# Patient Record
Sex: Male | Born: 1985
Health system: Southern US, Community
[De-identification: ages and names within clinical notes are randomized; demographics above are authoritative.]

## PROBLEM LIST (undated history)

## (undated) DIAGNOSIS — I10 Essential (primary) hypertension: Secondary | ICD-10-CM

## (undated) DIAGNOSIS — E785 Hyperlipidemia, unspecified: Secondary | ICD-10-CM

## (undated) DIAGNOSIS — K219 Gastro-esophageal reflux disease without esophagitis: Secondary | ICD-10-CM

## (undated) DIAGNOSIS — E669 Obesity, unspecified: Secondary | ICD-10-CM

## (undated) HISTORY — PX: NASAL FRACTURE SURGERY: SHX718

## (undated) HISTORY — DX: Essential (primary) hypertension: I10

## (undated) HISTORY — DX: Obesity, unspecified: E66.9

## (undated) HISTORY — DX: Gastro-esophageal reflux disease without esophagitis: K21.9

## (undated) HISTORY — DX: Hyperlipidemia, unspecified: E78.5

---

## 2016-09-08 DIAGNOSIS — S46812A Strain of other muscles, fascia and tendons at shoulder and upper arm level, left arm, initial encounter: Secondary | ICD-10-CM | POA: Diagnosis not present

## 2016-09-08 DIAGNOSIS — M62838 Other muscle spasm: Secondary | ICD-10-CM | POA: Diagnosis not present

## 2017-07-06 DIAGNOSIS — S0993XA Unspecified injury of face, initial encounter: Secondary | ICD-10-CM | POA: Diagnosis not present

## 2017-07-06 DIAGNOSIS — G8911 Acute pain due to trauma: Secondary | ICD-10-CM | POA: Diagnosis not present

## 2017-07-06 DIAGNOSIS — S0033XA Contusion of nose, initial encounter: Secondary | ICD-10-CM | POA: Diagnosis not present

## 2017-07-06 DIAGNOSIS — Z79899 Other long term (current) drug therapy: Secondary | ICD-10-CM | POA: Diagnosis not present

## 2017-07-06 DIAGNOSIS — J320 Chronic maxillary sinusitis: Secondary | ICD-10-CM | POA: Diagnosis not present

## 2017-07-06 DIAGNOSIS — S0083XA Contusion of other part of head, initial encounter: Secondary | ICD-10-CM | POA: Diagnosis not present

## 2017-07-06 DIAGNOSIS — S0992XA Unspecified injury of nose, initial encounter: Secondary | ICD-10-CM | POA: Diagnosis not present

## 2017-11-16 DIAGNOSIS — Z23 Encounter for immunization: Secondary | ICD-10-CM | POA: Diagnosis not present

## 2017-11-16 DIAGNOSIS — M549 Dorsalgia, unspecified: Secondary | ICD-10-CM | POA: Diagnosis not present

## 2017-11-16 DIAGNOSIS — R030 Elevated blood-pressure reading, without diagnosis of hypertension: Secondary | ICD-10-CM | POA: Diagnosis not present

## 2017-12-25 DIAGNOSIS — J0140 Acute pansinusitis, unspecified: Secondary | ICD-10-CM | POA: Diagnosis not present

## 2018-02-02 ENCOUNTER — Ambulatory Visit (INDEPENDENT_AMBULATORY_CARE_PROVIDER_SITE_OTHER): Payer: BLUE CROSS/BLUE SHIELD

## 2018-02-02 ENCOUNTER — Ambulatory Visit: Payer: BLUE CROSS/BLUE SHIELD | Admitting: Physician Assistant

## 2018-02-02 ENCOUNTER — Encounter: Payer: Self-pay | Admitting: Physician Assistant

## 2018-02-02 VITALS — BP 146/96 | HR 80 | Ht 70.0 in | Wt 262.0 lb

## 2018-02-02 DIAGNOSIS — Z13220 Encounter for screening for lipoid disorders: Secondary | ICD-10-CM

## 2018-02-02 DIAGNOSIS — F411 Generalized anxiety disorder: Secondary | ICD-10-CM

## 2018-02-02 DIAGNOSIS — R030 Elevated blood-pressure reading, without diagnosis of hypertension: Secondary | ICD-10-CM

## 2018-02-02 DIAGNOSIS — R399 Unspecified symptoms and signs involving the genitourinary system: Secondary | ICD-10-CM | POA: Diagnosis not present

## 2018-02-02 DIAGNOSIS — R1310 Dysphagia, unspecified: Secondary | ICD-10-CM

## 2018-02-02 DIAGNOSIS — R1013 Epigastric pain: Secondary | ICD-10-CM

## 2018-02-02 DIAGNOSIS — M7989 Other specified soft tissue disorders: Secondary | ICD-10-CM | POA: Diagnosis not present

## 2018-02-02 DIAGNOSIS — R740 Nonspecific elevation of levels of transaminase and lactic acid dehydrogenase [LDH]: Secondary | ICD-10-CM

## 2018-02-02 DIAGNOSIS — F341 Dysthymic disorder: Secondary | ICD-10-CM

## 2018-02-02 DIAGNOSIS — R7401 Elevation of levels of liver transaminase levels: Secondary | ICD-10-CM

## 2018-02-02 DIAGNOSIS — R351 Nocturia: Secondary | ICD-10-CM | POA: Diagnosis not present

## 2018-02-02 DIAGNOSIS — Z131 Encounter for screening for diabetes mellitus: Secondary | ICD-10-CM

## 2018-02-02 DIAGNOSIS — G8929 Other chronic pain: Secondary | ICD-10-CM | POA: Diagnosis not present

## 2018-02-02 DIAGNOSIS — Z130 Encounter for screening for diseases of the blood and blood-forming organs and certain disorders involving the immune mechanism: Secondary | ICD-10-CM

## 2018-02-02 DIAGNOSIS — M25571 Pain in right ankle and joints of right foot: Secondary | ICD-10-CM

## 2018-02-02 DIAGNOSIS — Z7689 Persons encountering health services in other specified circumstances: Secondary | ICD-10-CM

## 2018-02-02 DIAGNOSIS — K6289 Other specified diseases of anus and rectum: Secondary | ICD-10-CM

## 2018-02-02 DIAGNOSIS — K5909 Other constipation: Secondary | ICD-10-CM

## 2018-02-02 DIAGNOSIS — F909 Attention-deficit hyperactivity disorder, unspecified type: Secondary | ICD-10-CM

## 2018-02-02 LAB — POCT URINALYSIS DIPSTICK
Bilirubin, UA: NEGATIVE
Blood, UA: NEGATIVE
Glucose, UA: NEGATIVE
Ketones, UA: NEGATIVE
Leukocytes, UA: NEGATIVE
Nitrite, UA: NEGATIVE
Protein, UA: NEGATIVE
SPEC GRAV UA: 1.02 (ref 1.010–1.025)
Urobilinogen, UA: 1 E.U./dL
pH, UA: 8.5 — AB (ref 5.0–8.0)

## 2018-02-02 MED ORDER — DOCUSATE SODIUM 100 MG PO CAPS: 100.0000 mg | ORAL_CAPSULE | Freq: Two times a day (BID) | ORAL | 3 refills | Status: DC

## 2018-02-02 MED ORDER — ATOMOXETINE HCL 40 MG PO CAPS: ORAL_CAPSULE | ORAL | 1 refills | Status: DC

## 2018-02-02 MED ORDER — NONFORMULARY OR COMPOUNDED ITEM: 0 refills | Status: DC

## 2018-02-02 MED ORDER — MELOXICAM 15 MG PO TABS: ORAL_TABLET | ORAL | 3 refills | Status: DC

## 2018-02-02 NOTE — Patient Instructions (Addendum)
1. Acid reflux - okay to continue the antacid medication - follow GERD diet/lifestyle - follow-up with Gastroenterology for possible endoscopy (referral was placed)  2. Urinary frequency - likely due to irritation from excess caffeine - reduce daily caffeine to 2 beverages and avoid caffeine after 3 pm  3. ADHD/Inattention - start Blase MessStraterra - see coping strategies below - follow-up in 1 month  4. Constipation/Rectal pain - start a fiber supplement like Metamucil or Psyllium - start stool softener 1-2 times per day - drink at least 64 onces of water daily - [facility] and apply rectally daily  5. Insomnia - stop Tylenol PM. This is going to get worse before it gets better but we have to re-train your body to sleep without medication - sleep hygiene  Sleep Hygiene . Limiting daytime naps to 30 minutes . Napping does not make up for inadequate nighttime sleep. However, a short nap of 20-30 minutes can help to improve mood, alertness and performance.  . Avoiding stimulants such as  caffeine and nicotine close to bedtime.  And when it comes to alcohol, moderation is key 4. While alcohol is well-known to help you fall asleep faster, too much close to bedtime can disrupt sleep in the second half of the night as the body begins to process the alcohol.    . Exercising to promote good quality sleep.  As little as 10 minutes of aerobic exercise, such as walking or cycling, can drastically improve nighttime sleep quality.  For the best night's sleep, most people should avoid strenuous workouts close to bedtime. However, the effect of intense nighttime exercise on sleep differs from person to person, so find out what works best for you.   . Steering clear of food that can be disruptive right before sleep.   Heavy or rich foods, fatty or fried meals, spicy dishes, citrus fruits, and carbonated drinks can trigger indigestion for some people. When this occurs close to  bedtime, it can lead to painful heartburn that disrupts sleep. . Ensuring adequate exposure to natural light.  This is particularly important for individuals who may not venture outside frequently. Exposure to sunlight during the day, as well as darkness at night, helps to maintain a healthy sleep-wake cycle . Marland Kitchen. Establishing a regular relaxing bedtime routine.  A regular nightly routine helps the body recognize that it is bedtime. This could include taking warm shower or bath, reading a book, or light stretches. When possible, try to avoid emotionally upsetting conversations and activities before attempting to sleep. . Making sure that the sleep environment is pleasant.  Mattress and pillows should be comfortable. The bedroom should be cool - between 60 and 67 degrees - for optimal sleep. Bright light from lamps, cell phone and TV screens can make it difficult to fall asleep4, so turn those light off or adjust them when possible. Consider using blackout curtains, eye shades, ear plugs, "white noise" machines, humidifiers, fans and other devices that can make the bedroom more relaxing. . Meditation. YouTube Kristopher GleeMichael Sealy. There are many smartphone apps as well     Food Choices for Gastroesophageal Reflux Disease, Adult When you have gastroesophageal reflux disease (GERD), the foods you eat and your eating habits are very important. Choosing the right foods can help ease the discomfort of GERD. Consider working with a diet and nutrition specialist (dietitian) to help you make healthy food choices. What general guidelines should I follow?  Eating plan  Choose healthy foods low in  fat, such as fruits, vegetables, whole grains, low-fat dairy products, and lean meat, fish, and poultry.  Eat frequent, small meals instead of three large meals each day. Eat your meals slowly, in a relaxed setting. Avoid bending over or lying down until 2-3 hours after eating.  Limit high-fat foods such as fatty meats or  fried foods.  Limit your intake of oils, butter, and shortening to less than 8 teaspoons each day.  Avoid the following: ? Foods that cause symptoms. These may be different for different people. Keep a food diary to keep track of foods that cause symptoms. ? Alcohol. ? Drinking large amounts of liquid with meals. ? Eating meals during the 2-3 hours before bed.  Cook foods using methods other than frying. This may include baking, grilling, or broiling. Lifestyle  Maintain a healthy weight. Ask your health care provider what weight is healthy for you. If you need to lose weight, work with your health care provider to do so safely.  Exercise for at least 30 minutes on 5 or more days each week, or as told by your health care provider.  Avoid wearing clothes that fit tightly around your waist and chest.  Do not use any products that contain nicotine or tobacco, such as cigarettes and e-cigarettes. If you need help quitting, ask your health care provider.  Sleep with the head of your bed raised. Use a wedge under the mattress or blocks under the bed frame to raise the head of the bed. What foods are not recommended? The items listed may not be a complete list. Talk with your dietitian about what dietary choices are best for you. Grains Pastries or quick breads with added fat. [place] toast. Vegetables Deep fried vegetables. [place] fries. Any vegetables prepared with added fat. Any vegetables that cause symptoms. For some people this may include tomatoes and tomato products, chili peppers, onions and garlic, and horseradish. Fruits Any fruits prepared with added fat. Any fruits that cause symptoms. For some people this may include citrus fruits, such as oranges, grapefruit, pineapple, and lemons. Meats and other protein foods High-fat meats, such as fatty beef or pork, hot dogs, ribs, ham, sausage, salami and bacon. Fried meat or protein, including fried fish and fried chicken. Nuts and nut  butters. Dairy Whole milk and chocolate milk. Sour cream. Cream. Ice cream. Cream cheese. Milk shakes. Beverages Coffee and tea, with or without caffeine. Carbonated beverages. Sodas. Energy drinks. Fruit juice made with acidic fruits (such as orange or grapefruit). Tomato juice. Alcoholic drinks. Fats and oils Butter. Margarine. Shortening. Ghee. Sweets and desserts Chocolate and cocoa. Donuts. Seasoning and other foods Pepper. Peppermint and spearmint. Any condiments, herbs, or seasonings that cause symptoms. For some people, this may include curry, hot sauce, or vinegar-based salad dressings. Summary  When you have gastroesophageal reflux disease (GERD), food and lifestyle choices are very important to help ease the discomfort of GERD.  Eat frequent, small meals instead of three large meals each day. Eat your meals slowly, in a relaxed setting. Avoid bending over or lying down until 2-3 hours after eating.  Limit high-fat foods such as fatty meat or fried foods. This information is not intended to replace advice given to you by your health care provider. Make sure you discuss any questions you have with your health care provider. Document Released: 01/24/2005 Document Revised: 01/26/2016 Document Reviewed: 01/26/2016 Elsevier Interactive Patient Education  2019 [employer] Inc.  Urinary Frequency, Adult Urinary frequency means urinating more often than usual.  You may urinate every 1-2 hours even though you drink a normal amount of fluid and do not have a bladder infection or condition. Although you urinate more often than normal, the total amount of urine produced in a day is normal. With urinary frequency, you may have an urgent need to urinate often. The stress and anxiety of needing to find a bathroom quickly can make this urge worse. This condition may go away on its own or you may need treatment at home. Home treatment may include bladder training, exercises, taking medicines, or making  changes to your diet. Follow these instructions at home: Bladder health   Keep a bladder diary if told by your health care provider. Keep track of: ? What you eat and drink. ? How often you urinate. ? How much you urinate.  Follow a bladder training program if told by your health care provider. This may include: ? Learning to delay going to the bathroom. ? Double urinating (voiding). This helps if you are not completely emptying your bladder. ? Scheduled voiding.  Do Kegel exercises as told by your health care provider. Kegel exercises strengthen the muscles that help control urination, which may help the condition. Eating and drinking  If told by your health care provider, make diet changes, such as: ? Avoiding caffeine. ? Drinking fewer fluids, especially alcohol. ? Not drinking in the evening. ? Avoiding foods or drinks that may irritate the bladder. These include coffee, tea, soda, artificial sweeteners, citrus, tomato-based foods, and chocolate. ? Eating foods that help prevent or ease constipation. Constipation can make this condition worse. Your health care provider may recommend that you:  Drink enough fluid to keep your urine pale yellow.  Take over-the-counter or prescription medicines.  Eat foods that are high in fiber, such as beans, whole grains, and fresh fruits and vegetables.  Limit foods that are high in fat and processed sugars, such as fried or sweet foods. General instructions  Take over-the-counter and prescription medicines only as told by your health care provider.  Keep all follow-up visits as told by your health care provider. This is important. Contact a health care provider if:  You start urinating more often.  You feel pain or irritation when you urinate.  You notice blood in your urine.  Your urine looks cloudy.  You develop a fever.  You begin vomiting. Get help right away if:  You are unable to urinate. Summary  Urinary frequency  means urinating more often than usual. With urinary frequency, you may urinate every 1-2 hours even though you drink a normal amount of fluid and do not have a bladder infection or other bladder condition.  Your health care provider may recommend that you keep a bladder diary, follow a bladder training program, or make dietary changes.  If told by your health care provider, do Kegel exercises to strengthen the muscles that help control urination.  Take over-the-counter and prescription medicines only as told by your health care provider.  Contact a health care provider if your symptoms do not improve or get worse. This information is not intended to replace advice given to you by your health care provider. Make sure you discuss any questions you have with your health care provider. Document Released: 11/20/2008 Document Revised: 08/03/2017 Document Reviewed: 08/03/2017 Elsevier Interactive Patient Education  2019 Elsevier Inc.   Attention Deficit Hyperactivity Disorder, Adult Attention deficit hyperactivity disorder (ADHD) is a mental health disorder that starts during childhood. For many people with ADHD, the  disorder continues into adult years. There are many things that you and your health care provider or therapist (mental health professional) can do to manage your symptoms. What are the causes? The exact cause of ADHD is not known. What increases the risk? You are more likely to develop this condition if:  You have a family history of ADHD.  You are male.  You were born to a mother who smoked or drank alcohol during pregnancy.  You were exposed to lead poisoning or other toxins in the womb or in early life.  You were born before 37 weeks of pregnancy (prematurely) or you had a low birth weight.  You have experienced a brain injury. What are the signs or symptoms? Symptoms of this condition depend on the type of ADHD. The two main types are inattentive and hyperactive-impulsive.  Some people may have symptoms of both types. Symptoms of the inattentive type include:  Difficulty watching, listening, or thinking with focused effort (paying attention).  Making careless mistakes.  Not listening.  Not following instructions.  Being disorganized.  Avoiding tasks that require time and attention.  Losing things.  Forgetting things.  Being easily distracted. Symptoms of the hyperactive-impulsive type include:  Restlessness.  Talking too much.  Interrupting.  Difficulty with: ? Sitting still. ? Staying quiet. ? Feeling motivated. ? Relaxing. ? Waiting in line or waiting for a turn. How is this diagnosed? This condition is diagnosed based on your current symptoms and your history of symptoms. The diagnosis can be made by a provider such as a primary care provider, psychiatrist, psychologist, or clinical social worker. The provider may use a symptom checklist or a standardized behavior rating scale to evaluate your symptoms. He or she may want to talk with family members who have known you for a long time and have observed your behaviors. There are no lab tests or brain imaging tests that can diagnose ADHD. How is this treated? This condition can be treated with medicines and behavior therapy. Medicines may be the best option to reduce impulsive behaviors and improve attention. Your health care provider may recommend:  Stimulant medicines. These are the most common medicines used for adult ADHD. They affect certain chemicals in the brain (neurotransmitters). These medicines may be long-acting or short-acting. This will determine how often you need to take the medicine.  A non-stimulant medicine for adult ADHD (atomoxetine). This medicine increases a neurotransmitter called norepinephrine. It may take weeks to months to see effects from this medicine. Psychotherapy and behavioral management are also important for treating ADHD. Psychotherapy is often used along  with medicine. Your health care provider may suggest:  Cognitive behavioral therapy (CBT). This type of therapy teaches you to replace negative thoughts and actions with positive thoughts and actions. When used as part of ADHD treatment, this therapy may also include: ? Coping strategies for organization, time management, impulse control, and stress reduction. ? Mindfulness and meditation training.  Behavioral management. This may include strategies for organization and time management. You may work with an ADHD coach who is specially trained to help people with ADHD to manage and organize activities and to function more effectively. Follow these instructions at home: Medicines   Take over-the-counter and prescription medicines only as told by your health care provider.  Talk with your health care provider about the possible side effects of your medicine to watch for. General instructions   Learn as much as you can about adult ADHD, and work closely with your health care  providers to find the treatments that work best for you.  Do not use drugs or abuse alcohol. Limit alcohol intake to no more than 1 drink a day for nonpregnant women and 2 drinks a day for men. One drink equals 12 oz of beer, 5 oz of wine, or 1 oz of hard liquor.  Follow the same schedule each day. Make sure your schedule includes enough time for you to get plenty of sleep.  Use reminder devices like notes, calendars, and phone apps to stay on-time and organized.  Eat a healthy diet. Do not skip meals.  Exercise regularly. Exercise can help to reduce stress and anxiety.  Keep all follow-up visits as told by your health care provider and therapist. This is important. Where to find more information  A health care provider may be able to recommend resources that are available online or over the phone. You could start with: ? Attention Deficit Disorder Association (ADDA): http://davis-dillon.net/www.add.org ? [employer]  [facility](NIMH): http://www.maynard.net/www.nimh.nih.gov Contact a health care provider if:  Your symptoms are changing, getting worse, or not improving.  You have side effects from your medicine, such as: ? Repeated muscle twitches, coughing, or speech outbursts. ? Sleep problems. ? Loss of appetite. ? Depression. ? New or worsening behavior problems. ? Dizziness. ? Unusually fast heartbeat. ? Stomach pains. ? Headaches.  You are struggling with anxiety, depression, or substance abuse. Get help right away if:  You have a severe reaction to a medicine.  You have thoughts of hurting yourself or others. If you ever feel like you may hurt yourself or others, or have thoughts about taking your own life, get help right away. You can go to the nearest emergency department or call:  Your local emergency services (911 in the [place]  A suicide crisis helpline, such as the National Suicide Prevention Lifeline at 225-575-47491-8580066531. This is open 24 hours a day. Summary  ADHD is a mental health disorder that starts during childhood and often continues into adult years.  The exact cause of ADHD is not known.  There is no cure for ADHD, but treatment with medicine, therapy, or behavioral training can help you manage your condition. This information is not intended to replace advice given to you by your health care provider. Make sure you discuss any questions you have with your health care provider. Document Released: 09/15/2016 Document Revised: 09/15/2016 Document Reviewed: 09/15/2016 Elsevier Interactive Patient Education  2019 [employer]

## 2018-02-02 NOTE — Assessment & Plan Note (Signed)
For years now. We will start conservatively, no pain today. Meloxicam, x-rays, home rehab exercises. Return for custom molded orthotics.

## 2018-02-02 NOTE — Progress Notes (Signed)
HPI:                                                                Steve Bentley is a 32 y.o. male who presents to [facility]: Primary Care Sports Medicine today to establish care  Current concerns:   Endorses chronic nocturia up to 4-5 times per night since age 35. Sometimes he has pressure and discomfort in his testicular area with voiding. Denies exposure to STI. Sexually active with 1 male partner (wife) Admits to drinking 4-5 sodas per day and 1 energy drink per day  IPSS Questionnaire (AUA-7): Over the past month.   1)  How often have you had a sensation of not emptying your bladder completely after you finish urinating?  5 - Almost always  2)  How often have you had to urinate again less than two hours after you finished urinating? 4 - More than half the time  3)  How often have you found you stopped and started again several times when you urinated?  2 - Less than half the time  4) How difficult have you found it to postpone urination?  3 - About half the time  5) How often have you had a weak urinary stream?  2 - Less than half the time  6) How often have you had to push or strain to begin urination?  1 - Less than half the time  7) How many times did you most typically get up to urinate from the time you went to bed until the time you got up in the morning?  4 - 4 time  Total score:  0-7 mildly symptomatic   8-19 moderately symptomatic   20-35 severely symptomatic    He also states he has pretty bad acid reflux. Reports he gets bubbly sensation in his throat  He has been taking mothers Omeprazole prescription once-twice per day for the last 2 years Occasional regurgitation and dysphagia where he can only swallow liquids.   Reports he has been having focus issues at work. This has been a chronic problem for 5 years, gradually worsening over the last 2 years. 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 Reports he has made careless errors. Self-reported history of ADD, diagnosed age 89. No current medications Reports he has been taking Tylenol PM at bedtime for 15 years.    Depression screen PHQ 2/9 02/02/2018  Decreased Interest 1  Down, Depressed, Hopeless 1  PHQ - 2 Score 2  Altered sleeping 3  Tired, decreased energy 1  Change in appetite 1  Feeling bad or failure about yourself  0  Trouble concentrating 3  Moving slowly or fidgety/restless 3  Suicidal thoughts 0  PHQ-9 Score 13  Difficult doing work/chores Extremely dIfficult    GAD 7 : Generalized Anxiety Score 02/02/2018  Nervous, Anxious, on Edge 1  Control/stop worrying 0  Worry too much - different things 0  Trouble relaxing 3  Restless 2  Easily annoyed or irritable 1  Afraid - awful might happen 0  Total GAD 7 Score 7  Anxiety Difficulty Extremely difficult       Adult ADHD Self Report Scale (most recent)  Adult ADHD Self-Report Scale (ASRS-v1.1) Symptom Checklist - 02/02/18 1359      Part A   1. How often do you have trouble wrapping up the final details of a project, once the challenging parts have been done?  (!) Very Often  2. How often do you have difficulty getting things done in order when you have to do a task that requires organization?  (!) Very Often    3. How often do you have problems remembering appointments or obligations?  (!) Sometimes  4. When you have a task that requires a lot of thought, how often do you avoid or delay getting started?  (!) Often    5. How often do you fidget or squirm with your hands or feet when you have to sit down for a long time?  (!) Very Often  6. How often do you feel overly active and compelled to do things, like you were driven by a motor?  Sometimes      Part B   7. How often do you make careless mistakes when you have to work on a boring or difficult project?  (!) Very Often  8. How often do you have difficulty keeping your attention when you are doing  boring or repetitive work?  (!) Very Often    9. How often do you have difficulty concentrating on what people say to you, even when they are speaking to you directly?  (!) Often  10. How often do you misplace or have difficulty finding things at home or at work?  Sometimes    11. How often are you distracted by activity or noise around you?  (!) Often  12. How often do you leave your seat in meetings or other situations in which you are expected to remain seated?  (!) Sometimes    13. How often do you feel restless or fidgety?  (!) Very Often  14. How often do you have difficulty unwinding and relaxing when you have time to yourself?  (!) Very Often    15. How often do you find yourself talking too much when you are in social situations?  Rarely  16. When you are in a conversation, how often do you find yourself finishing the sentences of the people you are talking to, before they can finish them themselves?  (!) Often    17. How often do you have difficulty waiting your turn in situations when turn taking is required?  (!) Very Often  18. How often do you interrupt others when they are busy?  (!) Sometimes      Comment   How old were you when these problems first began to occur?  9            Past Medical History:  Diagnosis Date  . GERD (gastroesophageal reflux disease)    Past Surgical History:  Procedure Laterality Date  . NASAL FRACTURE SURGERY     Social History   Tobacco Use  . Smoking status: Never Smoker  . Smokeless tobacco: Never Used  Substance Use Topics  . Alcohol use: Yes    Alcohol/week: 1.0 standard drinks    Types: 1 Cans of beer per week   family history includes COPD in his mother; Diabetes in his maternal aunt, maternal aunt, and maternal uncle; GER disease in his mother; Glaucoma in his mother; Skin cancer in his maternal uncle.    ROS: Review of Systems  Gastrointestinal: Positive for heartburn.  Genitourinary:       +  nocturia  Musculoskeletal:  Positive for back pain and joint pain.  Psychiatric/Behavioral: The patient has insomnia.        + inattention     Medications: Current Outpatient Medications  Medication Sig Dispense Refill  . diphenhydramine-acetaminophen (TYLENOL PM) 25-500 MG TABS tablet Take 1 tablet by mouth at bedtime as needed.    Marland Kitchen atomoxetine (STRATTERA) 40 MG capsule Take 1 capsule (40 mg total) by mouth daily for 7 days, THEN 2 capsules (80 mg total) daily for 23 days. 60 capsule 1  . docusate sodium (COLACE) 100 MG capsule Take 1 capsule (100 mg total) by mouth 2 (two) times daily. 90 capsule 3  . meloxicam (MOBIC) 15 MG tablet One tab PO qAM with breakfast for 2 weeks, then daily prn pain. 30 tablet 3  . NONFORMULARY OR COMPOUNDED ITEM Diltiazem 2.7% plus lidocaine ointment. Apply rectally daily 1 each 0   No current facility-administered medications for this visit.    No Known Allergies     Objective:  BP (!) 146/96   Pulse 80   Ht 5\' 10"  (1.778 m)   Wt 262 lb (118.8 kg)   BMI 37.59 kg/m  Gen:  alert, not ill-appearing, no distress, appropriate for age, obese male HEENT: head normocephalic without obvious abnormality, conjunctiva and cornea clear, trachea midline Pulm: Normal work of breathing, normal phonation, clear to auscultation bilaterally, no wheezes, rales or rhonchi CV: Normal rate, regular rhythm, s1 and s2 distinct, no murmurs, clicks or rubs  Neuro: alert and oriented x 3, no tremor MSK: extremities atraumatic, normal gait and station GU: circumcised male, normal descended testes bilaterally, no tenderness of the teste or epididymis bilaterally Rectum: multiple skin tags, normal rectal tone, tenderness of the lower internal anal wall at 6 o'clock, no visualized anal fissure Skin: intact, no rashes on exposed skin, no jaundice, no cyanosis Psych: well-groomed, cooperative, good eye contact, euthymic mood, affect mood-congruent, speech is articulate, and thought processes clear and  goal-directed  A chaperone was used for the GU/rectal exam, Ardell Isaacs, RMA  Results for orders placed or performed in visit on 02/02/18 (from the past 72 hour(s))  POCT Urinalysis Dipstick     Status: Abnormal   Collection Time: 02/02/18  2:01 PM  Result Value Ref Range   Color, UA yellow    Clarity, UA clear    Glucose, UA Negative Negative   Bilirubin, UA negative    Ketones, UA negative    Spec Grav, UA 1.020 1.010 - 1.025   Blood, UA negative    pH, UA 8.5 (A) 5.0 - 8.0   Protein, UA Negative Negative   Urobilinogen, UA 1.0 0.2 or 1.0 E.U./dL   Nitrite, UA negative    Leukocytes, UA Negative Negative   Appearance     Odor    CBC     Status: None   Collection Time: 02/02/18  2:40 PM  Result Value Ref Range   WBC 6.9 3.8 - 10.8 Thousand/uL   RBC 5.45 4.20 - 5.80 Million/uL   Hemoglobin 15.8 13.2 - 17.1 g/dL   HCT 16.1 09.6 - 04.5 %   MCV 85.5 80.0 - 100.0 fL   MCH 29.0 27.0 - 33.0 pg   MCHC 33.9 32.0 - 36.0 g/dL   RDW 40.9 81.1 - 91.4 %   Platelets 301 140 - 400 Thousand/uL   MPV 9.6 7.5 - 12.5 fL  COMPLETE METABOLIC PANEL WITH GFR     Status: Abnormal   Collection Time: 02/02/18  2:40 PM  Result Value Ref Range   Glucose, Bld 101 (H) 65 - 99 mg/dL    Comment: .            Fasting reference interval . For someone without known diabetes, a glucose value between 100 and 125 mg/dL is consistent with prediabetes and should be confirmed with a follow-up test. .    BUN 10 7 - 25 mg/dL   Creat 1.611.04 0.960.60 - 0.451.35 mg/dL   GFR, Est Non African American 95 > OR = 60 mL/min/1.7273m2   GFR, Est African American 110 > OR = 60 mL/min/1.6573m2   BUN/Creatinine Ratio NOT APPLICABLE 6 - 22 (calc)   Sodium 138 135 - 146 mmol/L   Potassium 4.1 3.5 - 5.3 mmol/L   Chloride 101 98 - 110 mmol/L   CO2 28 20 - 32 mmol/L   Calcium 9.6 8.6 - 10.3 mg/dL   Total Protein 7.5 6.1 - 8.1 g/dL   Albumin 4.4 3.6 - 5.1 g/dL   Globulin 3.1 1.9 - 3.7 g/dL (calc)   AG Ratio 1.4 1.0 - 2.5 (calc)    Total Bilirubin 0.4 0.2 - 1.2 mg/dL   Alkaline phosphatase (APISO) 61 40 - 115 U/L   AST 30 10 - 40 U/L   ALT 52 (H) 9 - 46 U/L  Hemoglobin A1c     Status: None   Collection Time: 02/02/18  2:40 PM  Result Value Ref Range   Hgb A1c MFr Bld 5.3 <5.7 % of total Hgb    Comment: For the purpose of screening for the presence of diabetes: . <5.7%       Consistent with the absence of diabetes 5.7-6.4%    Consistent with increased risk for diabetes             (prediabetes) > or =6.5%  Consistent with diabetes . This assay result is consistent with a decreased risk of diabetes. . Currently, no consensus exists regarding use of hemoglobin A1c for diagnosis of diabetes in children. . According to American Diabetes Association (ADA) guidelines, hemoglobin A1c <7.0% represents optimal control in non-pregnant diabetic patients. Different metrics may apply to specific patient populations.  Standards of Medical Care in Diabetes(ADA). .    Mean Plasma Glucose 105 (calc)   eAG (mmol/L) 5.8 (calc)  Lipid Panel w/reflex Direct LDL     Status: Abnormal   Collection Time: 02/02/18  2:40 PM  Result Value Ref Range   Cholesterol 188 <200 mg/dL   HDL 30 (L) >40>40 mg/dL   Triglycerides 981349 (H) <150 mg/dL    Comment: . If a non-fasting specimen was collected, consider repeat triglyceride testing on a fasting specimen if clinically indicated.  Perry MountJacobson et al. J. of Clin. Lipidol. 2015;9:129-169. Marland Kitchen.    LDL Cholesterol (Calc) 111 (H) mg/dL (calc)    Comment: Reference range: <100 . Desirable range <100 mg/dL for primary prevention;   <70 mg/dL for patients with CHD or diabetic patients  with > or = 2 CHD risk factors. Marland Kitchen. LDL-C is now calculated using the Martin-Hopkins  calculation, which is a validated novel method providing  better accuracy than the Friedewald equation in the  estimation of LDL-C.  Horald PollenMartin SS et al. Lenox AhrJAMA. 1914;782(952013;310(19): 2061-2068   (http://education.[facility].com/faq/FAQ164)    Total CHOL/HDL Ratio 6.3 (H) <5.0 (calc)   Non-HDL Cholesterol (Calc) 158 (H) <130 mg/dL (calc)    Comment: For patients with diabetes plus 1 major ASCVD risk  factor, treating to a non-HDL-C goal of <100  mg/dL  (LDL-C of <30<70 mg/dL) is considered a therapeutic  option.   Lipase     Status: None   Collection Time: 02/02/18  2:40 PM  Result Value Ref Range   Lipase 25 7 - 60 U/L  PSA     Status: None   Collection Time: 02/02/18  2:40 PM  Result Value Ref Range   PSA 0.2 < OR = 4.0 ng/mL    Comment: The total PSA value from this assay system is  standardized against the WHO standard. The test  result will be approximately 20% lower when compared  to the equimolar-standardized total PSA (Beckman  Coulter). Comparison of serial PSA results should be  interpreted with this fact in mind. . This test was performed using the Siemens  chemiluminescent method. Values obtained from  different assay methods cannot be used interchangeably. PSA levels, regardless of value, should not be interpreted as absolute evidence of the presence or absence of disease.   C. trachomatis/N. gonorrhoeae RNA     Status: None   Collection Time: 02/02/18  4:50 PM  Result Value Ref Range   C. trachomatis RNA, TMA NOT DETECTED NOT DETECT   N. gonorrhoeae RNA, TMA NOT DETECTED NOT DETECT    Comment: This test was performed using the APTIMA COMBO2 Assay ([employer] . The analytical performance characteristics of this  assay, when used to test SurePath specimens have been determined by [facility]. .    Dg Ankle Complete Right  Result Date: 02/02/2018 CLINICAL DATA:  Right ankle popping and swelling for 5 days. EXAM: RIGHT ANKLE - COMPLETE 3+ VIEW COMPARISON:  None. FINDINGS: There is no evidence of fracture, dislocation, or joint effusion. There is no evidence of arthropathy or other focal bone abnormality. Soft tissues are unremarkable.  IMPRESSION: Negative. Electronically Signed   By: Amie Portlandavid  Ormond M.D.   On: 02/02/2018 15:00      Assessment and Plan: 32 y.o. male with   .Steve JunesBrandon was seen today for establish care.  Diagnoses and all orders for this visit:  Encounter to establish care  Dyspepsia -     Ambulatory referral to Gastroenterology -     Lipase -     Lipase  Dysphagia, unspecified type -     Ambulatory referral to Gastroenterology  Nocturia -     POCT Urinalysis Dipstick -     PSA -     C. trachomatis/N. gonorrhoeae RNA -     PSA  Lower urinary tract symptoms (LUTS) -     PSA -     C. trachomatis/N. gonorrhoeae RNA -     PSA  Adult ADHD -     atomoxetine (STRATTERA) 40 MG capsule; Take 1 capsule (40 mg total) by mouth daily for 7 days, THEN 2 capsules (80 mg total) daily for 23 days.  Screening for diabetes mellitus -     Hemoglobin A1c -     POCT Urinalysis Dipstick  Screening for lipid disorders -     Lipid Panel w/reflex Direct LDL  Elevated blood pressure reading -     COMPLETE METABOLIC PANEL WITH GFR  Screening for blood disease -     CBC  Chronic pain of right ankle -     DG Ankle Complete Right; Future -     meloxicam (MOBIC) 15 MG tablet; One tab PO qAM with breakfast for 2 weeks, then daily prn pain.  Chronic constipation -     docusate sodium (COLACE) 100 MG capsule;  Take 1 capsule (100 mg total) by mouth 2 (two) times daily.  Rectal pain -     NONFORMULARY OR COMPOUNDED ITEM; Diltiazem 2.7% plus lidocaine ointment. Apply rectally daily  GAD (generalized anxiety disorder)  Dysthymia   LUTS/nocturia - UA negative - PSA pending - unable to palpate prostate due to rectal pain, suspect anal fissure. Plan to treat fissure and re-attempt exam in 3-4 weeks - patient admits to drinking 4+ caffeinated beverages per day. This is likely the cause of his urinary frequency. Counseled to reduce caffeine to no more than 2 standard beverages per day and no caffeine after 2  pm - f/u in 1 month  Rectal pain - unable to visualize anal fissure, but given presence of anal skin tags and tenderness with digital exam with hx of constipation and straining, fissure is likely - treating with topical diltiazem/lidocaine QD, stool softeners, increased fluids  Dyspepsia - red flag symptoms include intermittent dysphagia and failure of PPI therapy - counseled on GERD lifestyle. Okay to cont PPI - referral placed to GI for possible EGD  Adult ADHD - ASSRS very positive. Reports hx of ADHD in childhood which was treated with medication - given elevated BP reading and positive PHQ9/GAD7 will start Strattera, start 40 mg and self-titrate to 80 mg  Patient education and anticipatory guidance given Patient agrees with treatment plan Follow-up in 1 month or sooner as needed if symptoms worsen or fail to improve  I spent 40 minutes with this patient, greater than 50% was face-to-face time counseling regarding the above diagnoses  Levonne Hubert PA-C

## 2018-02-02 NOTE — Progress Notes (Signed)
Subjective:    I'm seeing this patient as a consultation for: Steve Frayharley Cummings, PA-C  CC: Right ankle pain  HPI: This is a pleasant 32 year old male, years ago he was pushing an object at work, it hit the back of his leg.  He has had intermittent pain, swelling, mechanical symptoms deep within his ankle joint since then.  Moderate, intermittent, no radiation.  I reviewed the past medical history, family history, social history, surgical history, and allergies today and no changes were needed.  Please see the problem list section below in epic for further details.  Past Medical History: Past Medical History:  Diagnosis Date  . GERD (gastroesophageal reflux disease)    Past Surgical History: Past Surgical History:  Procedure Laterality Date  . NASAL FRACTURE SURGERY     Social History: Social History   Socioeconomic History  . Marital status: Married    Spouse name: Not on file  . Number of children: Not on file  . Years of education: Not on file  . Highest education level: Not on file  Occupational History  . Not on file  Social Needs  . Financial resource strain: Not on file  . Food insecurity:    Worry: Not on file    Inability: Not on file  . Transportation needs:    Medical: Not on file    Non-medical: Not on file  Tobacco Use  . Smoking status: Never Smoker  . Smokeless tobacco: Never Used  Substance and Sexual Activity  . Alcohol use: Yes    Alcohol/week: 1.0 standard drinks    Types: 1 Cans of beer per week  . Drug use: Never  . Sexual activity: Yes  Lifestyle  . Physical activity:    Days per week: Not on file    Minutes per session: Not on file  . Stress: Not on file  Relationships  . Social connections:    Talks on phone: Not on file    Gets together: Not on file    Attends religious service: Not on file    Active member of club or organization: Not on file    Attends meetings of clubs or organizations: Not on file    Relationship status: Not  on file  Other Topics Concern  . Not on file  Social History Narrative  . Not on file   Family History: Family History  Problem Relation Age of Onset  . GER disease Mother   . COPD Mother   . Glaucoma Mother   . Diabetes Maternal Aunt   . Skin cancer Maternal Uncle   . Diabetes Maternal Uncle   . Diabetes Maternal Aunt    Allergies: No Known Allergies Medications: See med rec.  Review of Systems: No headache, visual changes, nausea, vomiting, diarrhea, constipation, dizziness, abdominal pain, skin rash, fevers, chills, night sweats, weight loss, swollen lymph nodes, body aches, joint swelling, muscle aches, chest pain, shortness of breath, mood changes, visual or auditory hallucinations.   Objective:   General: Well Developed, well nourished, and in no acute distress.  Neuro:  Extra-ocular muscles intact, able to move all 4 extremities, sensation grossly intact.  Deep tendon reflexes tested were normal. Psych: Alert and oriented, mood congruent with affect. ENT:  Ears and nose appear unremarkable.  Hearing grossly normal. Neck: Unremarkable overall appearance, trachea midline.  No visible thyroid enlargement. Eyes: Conjunctivae and lids appear unremarkable.  Pupils equal and round. Skin: Warm and dry, no rashes noted.  Cardiovascular: Pulses palpable, no extremity  edema. Right ankle: No visible erythema or swelling. Range of motion is full in all directions. Strength is 5/5 in all directions. Stable lateral and medial ligaments; squeeze test and kleiger test unremarkable; Talar dome nontender; No pain at base of 5th MT; No tenderness over cuboid; No tenderness over N spot or navicular prominence No tenderness on posterior aspects of lateral and medial malleolus No sign of peroneal tendon subluxations; Negative tarsal tunnel tinel's Able to walk 4 steps.  Impression and Recommendations:   This case required medical decision making of moderate complexity.  Right ankle  pain For years now. We will start conservatively, no pain today. Meloxicam, x-rays, home rehab exercises. Return for custom molded orthotics.  ___________________________________________ Ihor Austinhomas J. Benjamin Stainhekkekandam, M.D., ABFM., CAQSM. Primary Care and Sports Medicine [facility]  Adjunct Professor of Family Medicine  [employer]

## 2018-02-03 LAB — CBC
HCT: 46.6 % (ref 38.5–50.0)
Hemoglobin: 15.8 g/dL (ref 13.2–17.1)
MCH: 29 pg (ref 27.0–33.0)
MCHC: 33.9 g/dL (ref 32.0–36.0)
MCV: 85.5 fL (ref 80.0–100.0)
MPV: 9.6 fL (ref 7.5–12.5)
Platelets: 301 10*3/uL (ref 140–400)
RBC: 5.45 10*6/uL (ref 4.20–5.80)
RDW: 12.9 % (ref 11.0–15.0)
WBC: 6.9 10*3/uL (ref 3.8–10.8)

## 2018-02-03 LAB — COMPLETE METABOLIC PANEL WITH GFR
AG Ratio: 1.4 (calc) (ref 1.0–2.5)
ALT: 52 U/L — ABNORMAL HIGH (ref 9–46)
AST: 30 U/L (ref 10–40)
Albumin: 4.4 g/dL (ref 3.6–5.1)
Alkaline phosphatase (APISO): 61 U/L (ref 40–115)
BUN: 10 mg/dL (ref 7–25)
CALCIUM: 9.6 mg/dL (ref 8.6–10.3)
CO2: 28 mmol/L (ref 20–32)
Chloride: 101 mmol/L (ref 98–110)
Creat: 1.04 mg/dL (ref 0.60–1.35)
GFR, Est African American: 110 mL/min/{1.73_m2} (ref >=60)
GFR, Est Non African American: 95 mL/min/{1.73_m2} (ref >=60)
Globulin: 3.1 g/dL (calc) (ref 1.9–3.7)
Glucose, Bld: 101 mg/dL — ABNORMAL HIGH (ref 65–99)
POTASSIUM: 4.1 mmol/L (ref 3.5–5.3)
SODIUM: 138 mmol/L (ref 135–146)
Total Bilirubin: 0.4 mg/dL (ref 0.2–1.2)
Total Protein: 7.5 g/dL (ref 6.1–8.1)

## 2018-02-03 LAB — LIPASE: Lipase: 25 U/L (ref 7–60)

## 2018-02-03 LAB — HEMOGLOBIN A1C
Hgb A1c MFr Bld: 5.3 % of total Hgb (ref <5.7)
Mean Plasma Glucose: 105 (calc)
eAG (mmol/L): 5.8 (calc)

## 2018-02-03 LAB — C. TRACHOMATIS/N. GONORRHOEAE RNA
C. trachomatis RNA, TMA: NOT DETECTED
N. GONORRHOEAE RNA, TMA: NOT DETECTED

## 2018-02-03 LAB — LIPID PANEL W/REFLEX DIRECT LDL
Cholesterol: 188 mg/dL (ref <200)
HDL: 30 mg/dL — ABNORMAL LOW (ref >40)
LDL Cholesterol (Calc): 111 mg/dL (calc) — ABNORMAL HIGH
Non-HDL Cholesterol (Calc): 158 mg/dL (calc) — ABNORMAL HIGH (ref <130)
Total CHOL/HDL Ratio: 6.3 (calc) — ABNORMAL HIGH (ref <5.0)
Triglycerides: 349 mg/dL — ABNORMAL HIGH (ref <150)

## 2018-02-03 LAB — PSA: PSA: 0.2 ng/mL (ref <=4.0)

## 2018-02-04 DIAGNOSIS — K6289 Other specified diseases of anus and rectum: Secondary | ICD-10-CM | POA: Insufficient documentation

## 2018-02-04 DIAGNOSIS — R030 Elevated blood-pressure reading, without diagnosis of hypertension: Secondary | ICD-10-CM | POA: Insufficient documentation

## 2018-02-04 DIAGNOSIS — F909 Attention-deficit hyperactivity disorder, unspecified type: Secondary | ICD-10-CM | POA: Insufficient documentation

## 2018-02-04 DIAGNOSIS — R399 Unspecified symptoms and signs involving the genitourinary system: Secondary | ICD-10-CM | POA: Insufficient documentation

## 2018-02-04 DIAGNOSIS — F411 Generalized anxiety disorder: Secondary | ICD-10-CM | POA: Insufficient documentation

## 2018-02-04 DIAGNOSIS — K5909 Other constipation: Secondary | ICD-10-CM | POA: Insufficient documentation

## 2018-02-04 DIAGNOSIS — F341 Dysthymic disorder: Secondary | ICD-10-CM | POA: Insufficient documentation

## 2018-02-04 DIAGNOSIS — R1310 Dysphagia, unspecified: Secondary | ICD-10-CM | POA: Insufficient documentation

## 2018-02-04 DIAGNOSIS — R1013 Epigastric pain: Secondary | ICD-10-CM | POA: Insufficient documentation

## 2018-02-07 DIAGNOSIS — R7401 Elevation of levels of liver transaminase levels: Secondary | ICD-10-CM | POA: Insufficient documentation

## 2018-02-07 DIAGNOSIS — R740 Nonspecific elevation of levels of transaminase and lactic acid dehydrogenase [LDH]: Secondary | ICD-10-CM

## 2018-02-07 NOTE — Progress Notes (Signed)
Liver enzyme is mildly increased. This is likely due to fatty liver disease. Triglycerides are also increased. Recommend following a low-fat, heart healthy diet, increasing physical activity and losing 5% of current body weight. Also recommend avoiding alcohol or limiting to no more than 1 drink per day. Recheck liver function in 3 months

## 2018-02-07 NOTE — Addendum Note (Signed)
Addended by: Gena Fray E on: 02/07/2018 03:09 PM   Modules accepted: Orders

## 2018-03-09 ENCOUNTER — Encounter: Payer: Self-pay | Admitting: Physician Assistant

## 2018-03-09 ENCOUNTER — Ambulatory Visit (INDEPENDENT_AMBULATORY_CARE_PROVIDER_SITE_OTHER): Payer: BLUE CROSS/BLUE SHIELD | Admitting: Sports Medicine

## 2018-03-09 ENCOUNTER — Ambulatory Visit: Payer: BLUE CROSS/BLUE SHIELD | Admitting: Physician Assistant

## 2018-03-09 VITALS — BP 137/91 | HR 80 | Wt 258.0 lb

## 2018-03-09 DIAGNOSIS — F909 Attention-deficit hyperactivity disorder, unspecified type: Secondary | ICD-10-CM | POA: Diagnosis not present

## 2018-03-09 DIAGNOSIS — E66812 Obesity, class 2: Secondary | ICD-10-CM

## 2018-03-09 DIAGNOSIS — I10 Essential (primary) hypertension: Secondary | ICD-10-CM | POA: Insufficient documentation

## 2018-03-09 DIAGNOSIS — E785 Hyperlipidemia, unspecified: Secondary | ICD-10-CM

## 2018-03-09 DIAGNOSIS — K6289 Other specified diseases of anus and rectum: Secondary | ICD-10-CM

## 2018-03-09 DIAGNOSIS — M25571 Pain in right ankle and joints of right foot: Secondary | ICD-10-CM | POA: Diagnosis not present

## 2018-03-09 DIAGNOSIS — Z23 Encounter for immunization: Secondary | ICD-10-CM | POA: Diagnosis not present

## 2018-03-09 DIAGNOSIS — G8929 Other chronic pain: Secondary | ICD-10-CM | POA: Diagnosis not present

## 2018-03-09 HISTORY — DX: Hyperlipidemia, unspecified: E78.5

## 2018-03-09 MED ORDER — AMLODIPINE BESYLATE 5 MG PO TABS: 5.0000 mg | ORAL_TABLET | Freq: Every day | ORAL | 5 refills | Status: DC

## 2018-03-09 MED ORDER — ATOMOXETINE HCL 100 MG PO CAPS: 100.0000 mg | ORAL_CAPSULE | Freq: Every day | ORAL | 1 refills | Status: DC

## 2018-03-09 NOTE — Patient Instructions (Signed)
Continue Diltiazem cream daily for 6-8 weeks Continue stool softener daily Try to drink at least 64 ounces of water per day   You can take your Straterra either first thing in the morning or afternoon/early evening. You can experiment with time of day  Take your blood pressure medication (Amlodipine) every morning  For your blood pressure: - Goal <130/80 (Ideally 120's/70's) - take your blood pressure medication in the morning (unless instructed differently) - monitor and log blood pressures at home - check around the same time each day in a relaxed setting - Limit salt to <2500 mg/day - Follow DASH (Dietary Approach to Stopping Hypertension) eating plan - Try to get at least 150 minutes of aerobic exercise per week - Aim to go on a brisk walk 30 minutes per day at least 5 days per week. If you're not active, gradually increase how long you walk by 5 minutes each week - limit alcohol: 2 standard drinks per day for men and 1 per day for women - avoid tobacco/nicotine products. Consider smoking cessation if you smoke - weight loss: 7% of current body weight can reduce your blood pressure by 5-10 points - follow-up at least every 6 months for your blood pressure. Follow-up sooner if your BP is not controlled   DASH Eating Plan DASH stands for "Dietary Approaches to Stop Hypertension." The DASH eating plan is a healthy eating plan that has been shown to reduce high blood pressure (hypertension). It may also reduce your risk for type 2 diabetes, heart disease, and stroke. The DASH eating plan may also help with weight loss. What are tips for following this plan?  General guidelines  Avoid eating more than 2,300 mg (milligrams) of salt (sodium) a day. If you have hypertension, you may need to reduce your sodium intake to 1,500 mg a day.  Limit alcohol intake to no more than 1 drink a day for nonpregnant women and 2 drinks a day for men. One drink equals 12 oz of beer, 5 oz of wine, or 1 oz  of hard liquor.  Work with your health care provider to maintain a healthy body weight or to lose weight. Ask what an ideal weight is for you.  Get at least 30 minutes of exercise that causes your heart to beat faster (aerobic exercise) most days of the week. Activities may include walking, swimming, or biking.  Work with your health care provider or diet and nutrition specialist (dietitian) to adjust your eating plan to your individual calorie needs. Reading food labels   Check food labels for the amount of sodium per serving. Choose foods with less than 5 percent of the Daily Value of sodium. Generally, foods with less than 300 mg of sodium per serving fit into this eating plan.  To find whole grains, look for the word "whole" as the first word in the ingredient list. Shopping  Buy products labeled as "low-sodium" or "no salt added."  Buy fresh foods. Avoid canned foods and premade or frozen meals. Cooking  Avoid adding salt when cooking. Use salt-free seasonings or herbs instead of table salt or sea salt. Check with your health care provider or pharmacist before using salt substitutes.  Do not fry foods. Cook foods using healthy methods such as baking, boiling, grilling, and broiling instead.  Cook with heart-healthy oils, such as olive, canola, soybean, or sunflower oil. Meal planning  Eat a balanced diet that includes: ? 5 or more servings of fruits and vegetables each day. At  each meal, try to fill half of your plate with fruits and vegetables. ? Up to 6-8 servings of whole grains each day. ? Less than 6 oz of lean meat, poultry, or fish each day. A 3-oz serving of meat is about the same size as a deck of cards. One egg equals 1 oz. ? 2 servings of low-fat dairy each day. ? A serving of nuts, seeds, or beans 5 times each week. ? Heart-healthy fats. Healthy fats called Omega-3 fatty acids are found in foods such as flaxseeds and coldwater fish, like sardines, salmon, and  mackerel.  Limit how much you eat of the following: ? Canned or prepackaged foods. ? Food that is high in trans fat, such as fried foods. ? Food that is high in saturated fat, such as fatty meat. ? Sweets, desserts, sugary drinks, and other foods with added sugar. ? Full-fat dairy products.  Do not salt foods before eating.  Try to eat at least 2 vegetarian meals each week.  Eat more home-cooked food and less restaurant, buffet, and fast food.  When eating at a restaurant, ask that your food be prepared with less salt or no salt, if possible. What foods are recommended? The items listed may not be a complete list. Talk with your dietitian about what dietary choices are best for you. Grains Whole-grain or whole-wheat bread. Whole-grain or whole-wheat pasta. Brown rice. Orpah Cobbatmeal. Quinoa. Bulgur. Whole-grain and low-sodium cereals. Pita bread. Low-fat, low-sodium crackers. Whole-wheat flour tortillas. Vegetables Fresh or frozen vegetables (raw, steamed, roasted, or grilled). Low-sodium or reduced-sodium tomato and vegetable juice. Low-sodium or reduced-sodium tomato sauce and tomato paste. Low-sodium or reduced-sodium canned vegetables. Fruits All fresh, dried, or frozen fruit. Canned fruit in natural juice (without added sugar). Meat and other protein foods Skinless chicken or Malawiturkey. Ground chicken or Mal[place]iturkey. Pork with fat trimmed off. Fish and seafood. Egg whites. Dried beans, peas, or lentils. Unsalted nuts, nut butters, and seeds. Unsalted canned beans. Lean cuts of beef with fat trimmed off. Low-sodium, lean deli meat. Dairy Low-fat (1%) or fat-free (skim) milk. Fat-free, low-fat, or reduced-fat cheeses. Nonfat, low-sodium ricotta or cottage cheese. Low-fat or nonfat yogurt. Low-fat, low-sodium cheese. Fats and oils Soft margarine without trans fats. Vegetable oil. Low-fat, reduced-fat, or light mayonnaise and salad dressings (reduced-sodium). Canola, safflower, olive, soybean, and  sunflower oils. Avocado. Seasoning and other foods Herbs. Spices. Seasoning mixes without salt. Unsalted popcorn and pretzels. Fat-free sweets. What foods are not recommended? The items listed may not be a complete list. Talk with your dietitian about what dietary choices are best for you. Grains Baked goods made with fat, such as croissants, muffins, or some breads. Dry pasta or rice meal packs. Vegetables Creamed or fried vegetables. Vegetables in a cheese sauce. Regular canned vegetables (not low-sodium or reduced-sodium). Regular canned tomato sauce and paste (not low-sodium or reduced-sodium). Regular tomato and vegetable juice (not low-sodium or reduced-sodium). Rosita FirePickles. Olives. Fruits Canned fruit in a light or heavy syrup. Fried fruit. Fruit in cream or butter sauce. Meat and other protein foods Fatty cuts of meat. Ribs. Fried meat. Tomasa BlaseBacon. Sausage. Bologna and other processed lunch meats. Salami. Fatback. Hotdogs. Bratwurst. Salted nuts and seeds. Canned beans with added salt. Canned or smoked fish. Whole eggs or egg yolks. Chicken or Malaw[place]urkey with skin. Dairy Whole or 2% milk, cream, and half-and-half. Whole or full-fat cream cheese. Whole-fat or sweetened yogurt. Full-fat cheese. Nondairy creamers. Whipped toppings. Processed cheese and cheese spreads. Fats and oils Butter. Stick margarine. Lard.  Shortening. Ghee. Bacon fat. Tropical oils, such as coconut, palm kernel, or palm oil. Seasoning and other foods Salted popcorn and pretzels. Onion salt, garlic salt, seasoned salt, table salt, and sea salt. Worcestershire sauce. Tartar sauce. Barbecue sauce. Teriyaki sauce. Soy sauce, including reduced-sodium. Steak sauce. Canned and packaged gravies. Fish sauce. Oyster sauce. Cocktail sauce. Horseradish that you find on the shelf. Ketchup. Mustard. Meat flavorings and tenderizers. Bouillon cubes. Hot sauce and Tabasco sauce. Premade or packaged marinades. Premade or packaged taco seasonings.  Relishes. Regular salad dressings. Where to find more information:  National Heart, Lung, and Blood Institute: PopSteam.is  American Heart Association: www.heart.org Summary  The DASH eating plan is a healthy eating plan that has been shown to reduce high blood pressure (hypertension). It may also reduce your risk for type 2 diabetes, heart disease, and stroke.  With the DASH eating plan, you should limit salt (sodium) intake to 2,300 mg a day. If you have hypertension, you may need to reduce your sodium intake to 1,500 mg a day.  When on the DASH eating plan, aim to eat more fresh fruits and vegetables, whole grains, lean proteins, low-fat dairy, and heart-healthy fats.  Work with your health care provider or diet and nutrition specialist (dietitian) to adjust your eating plan to your individual calorie needs. This information is not intended to replace advice given to you by your health care provider. Make sure you discuss any questions you have with your health care provider. Document Released: 01/13/2011 Document Revised: 01/18/2016 Document Reviewed: 01/18/2016 Elsevier Interactive Patient Education  2019 [employer].

## 2018-03-09 NOTE — Progress Notes (Signed)
    Patient was fitted for a : standard, cushioned, semi-rigid orthotic. The orthotic was heated and afterward the patient stood on the orthotic blank positioned on the orthotic stand. The patient was positioned in subtalar neutral position and 10 degrees of ankle dorsiflexion in a weight bearing stance. After completion of molding, a stable base was applied to the orthotic blank. The blank was ground to a stable position for weight bearing. Size: 13, trimmed Base: White EVA Additional Posting and Padding: None The patient ambulated these, and they were very comfortable. ___________________________________________ Ihor Austinhomas J. Benjamin Stainhekkekandam, M.D., ABFM., CAQSM. Primary Care and Sports Medicine [facility]  Adjunct Instructor of Family Medicine  [employer]

## 2018-03-09 NOTE — Assessment & Plan Note (Signed)
Present for years now. No more pain, continue meloxicam as needed, custom orthotics today. He is having some popping, nonpainful. Return in a month.   MRI if no better.

## 2018-03-09 NOTE — Progress Notes (Signed)
HPI:                                                                Steve Bentley is a 33 y.o. male who presents to [facility]: Primary Care Sports Medicine today for ADHD/BP follow-up  At last OV, started on Straterra self-titration to 80 mg daily. He states he feels very tired when he gets off of work most days, but otherwise has not noticed any difference in his ADHD symptoms. Reports he has been having focus issues at work. This has been a chronic problem for 5 years, gradually worsening over the last 2 years. 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 Reports he has made careless errors. Self-reported history of ADD, diagnosed age 62. No current medications Reports he has been taking Tylenol PM at bedtime for 15 years.  Reports he was using Diltiazem cream for about 1.5 weeks, but then he thinks he developed a hemorrhoid, so he stopped using it. Reports he is still having intermittent rectal pain with passing stool. He is taking the stool softener, which has been helpful.  Urinary frequency has improved with switching to caffeine-free soda and drinking more water.  Depression screen PHQ 2/9 02/02/2018  Decreased Interest 1  Down, Depressed, Hopeless 1  PHQ - 2 Score 2  Altered sleeping 3  Tired, decreased energy 1  Change in appetite 1  Feeling bad or failure about yourself  0  Trouble concentrating 3  Moving slowly or fidgety/restless 3  Suicidal thoughts 0  PHQ-9 Score 13  Difficult doing work/chores Extremely dIfficult    GAD 7 : Generalized Anxiety Score 02/02/2018  Nervous, Anxious, on Edge 1  Control/stop worrying 0  Worry too much - different things 0  Trouble relaxing 3  Restless 2  Easily annoyed or irritable 1  Afraid - awful might happen 0  Total GAD 7 Score 7  Anxiety Difficulty Extremely difficult      Past Medical History:  Diagnosis Date  . GERD (gastroesophageal reflux  disease)    Past Surgical History:  Procedure Laterality Date  . NASAL FRACTURE SURGERY     Social History   Tobacco Use  . Smoking status: Never Smoker  . Smokeless tobacco: Never Used  Substance Use Topics  . Alcohol use: Yes    Alcohol/week: 1.0 standard drinks    Types: 1 Cans of beer per week   family history includes COPD in his mother; Diabetes in his maternal aunt, maternal aunt, and maternal uncle; GER disease in his mother; Glaucoma in his mother; Skin cancer in his maternal uncle.    ROS: negative except as noted in the HPI  Medications: Current Outpatient Medications  Medication Sig Dispense Refill  . diphenhydramine-acetaminophen (TYLENOL PM) 25-500 MG TABS tablet Take 1 tablet by mouth at bedtime as needed.    . docusate sodium (COLACE) 100 MG capsule Take 1 capsule (100 mg total) by mouth 2 (two) times daily. 90 capsule 3  . meloxicam (MOBIC) 15 MG tablet One tab PO qAM with breakfast for 2 weeks, then daily prn pain. 30 tablet 3  . NONFORMULARY OR COMPOUNDED ITEM Diltiazem 2.7% plus lidocaine ointment. Apply rectally daily 1 each 0  .  amLODipine (NORVASC) 5 MG tablet Take 1 tablet (5 mg total) by mouth daily. 30 tablet 5  . atomoxetine (STRATTERA) 100 MG capsule Take 1 capsule (100 mg total) by mouth daily. 30 capsule 1   No current facility-administered medications for this visit.    No Known Allergies     Objective:  BP (!) 137/91   Pulse 80   Wt 258 lb (117 kg)   BMI 37.02 kg/m  Gen:  alert, not ill-appearing, no distress, appropriate for age, obese male HEENT: head normocephalic without obvious abnormality, conjunctiva and cornea clear, wearing glasses, trachea midline Pulm: Normal work of breathing, normal phonation Neuro: alert and oriented x 3, no tremor MSK: extremities atraumatic, normal gait and station Skin: intact, no rashes on exposed skin, no jaundice, no cyanosis Psych: well-groomed, cooperative, good eye contact, depressed mood, flat  affect, speech is articulate, and thought processes clear and goal-directed  BP Readings from Last 3 Encounters:  03/09/18 (!) 137/91  02/02/18 (!) 146/96   Lab Results  Component Value Date   CREATININE 1.04 02/02/2018   BUN 10 02/02/2018   NA 138 02/02/2018   K 4.1 02/02/2018   CL 101 02/02/2018   CO2 28 02/02/2018      Assessment and Plan: 33 y.o. male with   .Shawndre was seen today for medication management.  Diagnoses and all orders for this visit:  Adult ADHD -     atomoxetine (STRATTERA) 100 MG capsule; Take 1 capsule (100 mg total) by mouth daily.  Hypertension goal BP (blood pressure) < 130/80 -     amLODipine (NORVASC) 5 MG tablet; Take 1 tablet (5 mg total) by mouth daily.  Need for Tdap vaccination -     Tdap vaccine greater than or equal to 7yo IM  ADHD - it has been 4 weeks on Straterra. Has not noticed change in ADHD symptoms. We discussed that this medication can take some time to have full effects. Increasing to 100 mg QD - given his positive PHQ9 and GAD7, I will send him for ADHD testing if he does not respond to medication - continue to avoid stimulants due to uncontrolled HTN. We could consider this in the future if HTN is controlled with medication and lifestyle changes  HTN BP out of range at last 2 office visits, 2 readings in stage 2 range CVD risk factors include male sex, obesity, dyslipidemia/hypertriglyceridemia Starting Amlodipine 5 mg QD Counseled on therapeutic lifestyle changes including weight loss  Rectal pain At last OV, physical exam showed tenderness of the lower internal anal wall at 6 o'clock and multiple skin tags Continue Diltiazem cream for 6-8 weeks Cont Colace daily If symptoms persist despite conservative treatment for anal fissure, will refer to GI  Patient education and anticipatory guidance given Patient agrees with treatment plan Follow-up in 1 month for HTN/ADHD or sooner as needed if symptoms worsen or fail to  improve  Levonne Hubert PA-C

## 2018-04-06 ENCOUNTER — Ambulatory Visit: Payer: BLUE CROSS/BLUE SHIELD | Admitting: Sports Medicine

## 2018-04-06 ENCOUNTER — Ambulatory Visit: Payer: BLUE CROSS/BLUE SHIELD | Admitting: Physician Assistant

## 2018-04-20 ENCOUNTER — Encounter: Payer: Self-pay | Admitting: Physician Assistant

## 2018-06-07 ENCOUNTER — Encounter: Payer: Self-pay | Admitting: Physician Assistant

## 2018-06-15 ENCOUNTER — Ambulatory Visit (INDEPENDENT_AMBULATORY_CARE_PROVIDER_SITE_OTHER): Payer: BLUE CROSS/BLUE SHIELD | Admitting: Physician Assistant

## 2018-06-15 ENCOUNTER — Encounter: Payer: Self-pay | Admitting: Physician Assistant

## 2018-06-15 DIAGNOSIS — R41840 Attention and concentration deficit: Secondary | ICD-10-CM

## 2018-06-15 DIAGNOSIS — S99912A Unspecified injury of left ankle, initial encounter: Secondary | ICD-10-CM

## 2018-06-15 DIAGNOSIS — G8929 Other chronic pain: Secondary | ICD-10-CM | POA: Insufficient documentation

## 2018-06-15 DIAGNOSIS — I10 Essential (primary) hypertension: Secondary | ICD-10-CM

## 2018-06-15 DIAGNOSIS — W1789XA Other fall from one level to another, initial encounter: Secondary | ICD-10-CM | POA: Insufficient documentation

## 2018-06-15 DIAGNOSIS — F909 Attention-deficit hyperactivity disorder, unspecified type: Secondary | ICD-10-CM | POA: Diagnosis not present

## 2018-06-15 DIAGNOSIS — F411 Generalized anxiety disorder: Secondary | ICD-10-CM | POA: Diagnosis not present

## 2018-06-15 MED ORDER — AMLODIPINE BESYLATE 5 MG PO TABS: 5.0000 mg | ORAL_TABLET | Freq: Every day | ORAL | 0 refills | Status: DC

## 2018-06-15 MED ORDER — AMPHETAMINE-DEXTROAMPHETAMINE 10 MG PO TABS: 10.0000 mg | ORAL_TABLET | Freq: Two times a day (BID) | ORAL | 0 refills | Status: DC

## 2018-06-15 NOTE — Progress Notes (Signed)
Virtual Visit via Video Note  I connected with Barnett HatterBrandon Dollar on 06/15/18 at  1:20 PM EDT by a video enabled telemedicine application and verified that I am speaking with the correct person using two identifiers.   I discussed the limitations of evaluation and management by telemedicine and the availability of in person appointments. The patient expressed understanding and agreed to proceed.  History of Present Illness: HPI:                                                                Barnett HatterBrandon Ceasar is a 33 y.o. male   CC: med mgmt, left ankle pain   HTN: taking Amlodipine 5 mg daily. Compliant with medications. Does not check BP's at home. Denies vision change, headache, chest pain with exertion, orthopnea, lightheadedness, syncope and edema. Risk factors include: male sex, obesity, dyslipidemia  Reports fall at work about 6 weeks ago. He states he fell from a height of approx 2 feet, landing on a cylindrical object and rolled the outside of his left ankle. He immediately felt a pop and had intense pain and swelling. He states he was able to resume normal activity after resting for several minutes. He is now having intermittent pain with twisting/turning and sudden movements. Unable to play basketball with his son. Taking Tylenol/Ibuprofen as needed.  Adult ADD: patient with PMH of childhood ADHD presented in December with complaints of focus issues at work, gradually worsening over the last 5 years. He was started on Straterra and titrated to max dose. He self-discontinued Strattera approx 1 week ago. Reports no improvement in his symptoms with the medication. He states he has taken Adderall and Ritalin in the past; last time he took stimulant medication was around age 33.   Adult ADHD Self Report Scale (most recent)    Adult ADHD Self-Report Scale (ASRS-v1.1) Symptom Checklist - 06/15/18 1321      Part A   1. How often do you have trouble wrapping up the final details of a project, once  the challenging parts have been done?  (!) Often  2. How often do you have difficulty getting things done in order when you have to do a task that requires organization?  Rarely    3. How often do you have problems remembering appointments or obligations?  Rarely  4. When you have a task that requires a lot of thought, how often do you avoid or delay getting started?  (!) Often    5. How often do you fidget or squirm with your hands or feet when you have to sit down for a long time?  (!) Very Often  6. How often do you feel overly active and compelled to do things, like you were driven by a motor?  Never      Part B   7. How often do you make careless mistakes when you have to work on a boring or difficult project?  (!) Often  8. How often do you have difficulty keeping your attention when you are doing boring or repetitive work?  (!) Very Often    9. How often do you have difficulty concentrating on what people say to you, even when they are speaking to you directly?  (!) Often  10. How often  do you misplace or have difficulty finding things at home or at work?  (!) Often    11. How often are you distracted by activity or noise around you?  (!) Very Often  12. How often do you leave your seat in meetings or other situations in which you are expected to remain seated?  Never    13. How often do you feel restless or fidgety?  Sometimes  14. How often do you have difficulty unwinding and relaxing when you have time to yourself?  (!) Often    15. How often do you find yourself talking too much when you are in social situations?  Never  16. When you are in a conversation, how often do you find yourself finishing the sentences of the people you are talking to, before they can finish them themselves?  (!) Sometimes    17. How often do you have difficulty waiting your turn in situations when turn taking is required?  Sometimes  18. How often do you interrupt others when they are busy?  (!) Sometimes       Comment   How old were you when these problems first began to occur?  10          Depression screen [facility] 2/9 06/15/2018 02/02/2018  Decreased Interest 0 1  Down, Depressed, Hopeless 0 1  PHQ - 2 Score 0 2  Altered sleeping 1 3  Tired, decreased energy 2 1  Change in appetite 2 1  Feeling bad or failure about yourself  0 0  Trouble concentrating 3 3  Moving slowly or fidgety/restless 2 3  Suicidal thoughts 0 0  PHQ-9 Score 10 13  Difficult doing work/chores - Extremely dIfficult    GAD 7 : Generalized Anxiety Score 06/15/2018 02/02/2018  Nervous, Anxious, on Edge 0 1  Control/stop worrying 1 0  Worry too much - different things 1 0  Trouble relaxing 0 3  Restless 1 2  Easily annoyed or irritable 1 1  Afraid - awful might happen 0 0  Total GAD 7 Score 4 7  Anxiety Difficulty - Extremely difficult      Past Medical History:  Diagnosis Date  . Dyslipidemia 03/09/2018  . GERD (gastroesophageal reflux disease)    Past Surgical History:  Procedure Laterality Date  . NASAL FRACTURE SURGERY     Social History   Tobacco Use  . Smoking status: Never Smoker  . Smokeless tobacco: Never Used  Substance Use Topics  . Alcohol use: Yes    Alcohol/week: 1.0 standard drinks    Types: 1 Cans of beer per week   family history includes COPD in his mother; Diabetes in his maternal aunt, maternal aunt, and maternal uncle; GER disease in his mother; Glaucoma in his mother; Skin cancer in his maternal uncle.    ROS: negative except as noted in the HPI  Medications: Current Outpatient Medications  Medication Sig Dispense Refill  . amLODipine (NORVASC) 5 MG tablet Take 1 tablet (5 mg total) by mouth daily. 30 tablet 5  . docusate sodium (COLACE) 100 MG capsule Take 1 capsule (100 mg total) by mouth 2 (two) times daily. 90 capsule 3  . diphenhydramine-acetaminophen (TYLENOL PM) 25-500 MG TABS tablet Take 1 tablet by mouth at bedtime as needed.     No current facility-administered  medications for this visit.    No Known Allergies     Objective:  There were no vitals taken for this visit. Gen:  alert, not ill-appearing,  no distress, appropriate for age HEENT: head normocephalic without obvious abnormality, conjunctiva and cornea clear, trachea midline Pulm: Normal work of breathing, normal phonation Neuro: alert and oriented x 3 Psych: cooperative, euthymic mood, affect mood-congruent, speech is articulate, normal rate and volume; thought processes clear and goal-directed, normal judgment, good insight  BP Readings from Last 3 Encounters:  03/09/18 (!) 137/91  02/02/18 (!) 146/96   Lab Results  Component Value Date   CREATININE 1.04 02/02/2018   BUN 10 02/02/2018   NA 138 02/02/2018   K 4.1 02/02/2018   CL 101 02/02/2018   CO2 28 02/02/2018      Assessment and Plan: 33 y.o. male with   .Kadarrius was seen today for medication management.  Diagnoses and all orders for this visit:  Hypertension goal BP (blood pressure) < 130/80 -     amLODipine (NORVASC) 5 MG tablet; Take 1 tablet (5 mg total) by mouth daily.  GAD (generalized anxiety disorder)  Adult ADHD -     amphetamine-dextroamphetamine (ADDERALL) 10 MG tablet; Take 1 tablet (10 mg total) by mouth 2 (two) times daily with breakfast and lunch.  Inattention  Fall from height of less than 3 feet  Left ankle injury, initial encounter -     DG Ankle Complete Left; Future   HTN Patient unable to provide BP reading today He is compliant with medication Normal renal function F/u in office in 1 month  ADHD Positive ASRS, PHQ9=10, GAD7=4 Failed max dose straterra trial of 4 months Will start low-dose Adderall 10 mg bid Counseled on risks, especially cardiovascular risks and potential AE's Recommended he purchase a home BP machine and monitor F/u in office in 1 month  Left ankle inversion injury, Fall Left lateral ankle pain x 6 weeks after a fall from ht of 2 ft with inversion and  reported "pop" Concerning for syndesmotic injury versus lateral ankle sprain versus fracture X-ray pending Recommended ASO brace and supportive measures Provided with work note  F/u with Sports Medicine next week   Follow Up Instructions:    I discussed the assessment and treatment plan with the patient. The patient was provided an opportunity to ask questions and all were answered. The patient agreed with the plan and demonstrated an understanding of the instructions.   The patient was advised to call back or seek an in-person evaluation if the symptoms worsen or if the condition fails to improve as anticipated.  I provided approx 25 minutes of non-face-to-face time during this encounter.   Carlis Stable, [place]

## 2018-06-22 ENCOUNTER — Encounter: Payer: Self-pay | Admitting: Sports Medicine

## 2018-06-22 ENCOUNTER — Ambulatory Visit (INDEPENDENT_AMBULATORY_CARE_PROVIDER_SITE_OTHER): Payer: BLUE CROSS/BLUE SHIELD | Admitting: Sports Medicine

## 2018-06-22 ENCOUNTER — Other Ambulatory Visit: Payer: Self-pay

## 2018-06-22 ENCOUNTER — Ambulatory Visit (INDEPENDENT_AMBULATORY_CARE_PROVIDER_SITE_OTHER): Payer: BLUE CROSS/BLUE SHIELD

## 2018-06-22 DIAGNOSIS — M25572 Pain in left ankle and joints of left foot: Secondary | ICD-10-CM | POA: Diagnosis not present

## 2018-06-22 DIAGNOSIS — G8929 Other chronic pain: Secondary | ICD-10-CM | POA: Diagnosis not present

## 2018-06-22 DIAGNOSIS — S99912A Unspecified injury of left ankle, initial encounter: Secondary | ICD-10-CM

## 2018-06-22 MED ORDER — CELECOXIB 200 MG PO CAPS: ORAL_CAPSULE | ORAL | 2 refills | Status: DC

## 2018-06-22 NOTE — Progress Notes (Signed)
Subjective:    CC: Left ankle injury  HPI: Several months ago this 33 year old male fell, he landed awkwardly on his left ankle, he had immediate pain, swelling, it did not really improve much, he tried conservative measures including NSAIDs, activity modification without any improvement, pain is located deep within side the ankle joint and over the left and right talar domes.  Moderate, persistent.  I reviewed the past medical history, family history, social history, surgical history, and allergies today and no changes were needed.  Please see the problem list section below in epic for further details.  Past Medical History: Past Medical History:  Diagnosis Date  . Dyslipidemia 03/09/2018  . GERD (gastroesophageal reflux disease)   . Hypertension   . Obesity    Past Surgical History: Past Surgical History:  Procedure Laterality Date  . NASAL FRACTURE SURGERY     Social History: Social History   Socioeconomic History  . Marital status: Married    Spouse name: Not on file  . Number of children: Not on file  . Years of education: Not on file  . Highest education level: Not on file  Occupational History  . Not on file  Social Needs  . Financial resource strain: Not on file  . Food insecurity:    Worry: Not on file    Inability: Not on file  . Transportation needs:    Medical: Not on file    Non-medical: Not on file  Tobacco Use  . Smoking status: Never Smoker  . Smokeless tobacco: Never Used  Substance and Sexual Activity  . Alcohol use: Yes    Alcohol/week: 1.0 standard drinks    Types: 1 Cans of beer per week  . Drug use: Never  . Sexual activity: Yes  Lifestyle  . Physical activity:    Days per week: Not on file    Minutes per session: Not on file  . Stress: Not on file  Relationships  . Social connections:    Talks on phone: Not on file    Gets together: Not on file    Attends religious service: Not on file    Active member of club or organization: Not on  file    Attends meetings of clubs or organizations: Not on file    Relationship status: Not on file  Other Topics Concern  . Not on file  Social History Narrative  . Not on file   Family History: Family History  Problem Relation Age of Onset  . GER disease Mother   . COPD Mother   . Glaucoma Mother   . Diabetes Maternal Aunt   . Skin cancer Maternal Uncle   . Diabetes Maternal Uncle   . Diabetes Maternal Aunt    Allergies: Allergies  Allergen Reactions  . Strattera [Atomoxetine] Other (See Comments)    Ineffective for ADHD symptoms   Medications: See med rec.  Review of Systems: No fevers, chills, night sweats, weight loss, chest pain, or shortness of breath.   Objective:    General: Well Developed, well nourished, and in no acute distress.  Neuro: Alert and oriented x3, extra-ocular muscles intact, sensation grossly intact.  HEENT: Normocephalic, atraumatic, pupils equal round reactive to light, neck supple, no masses, no lymphadenopathy, thyroid nonpalpable.  Skin: Warm and dry, no rashes. Cardiac: Regular rate and rhythm, no murmurs rubs or gallops, no lower extremity edema.  Respiratory: Clear to auscultation bilaterally. Not using accessory muscles, speaking in full sentences. Left ankle: Minimally swollen Range of motion  is full in all directions.  Crepitus as the ankle was taken through the range of motion Strength is 5/5 in all directions. Stable lateral and medial ligaments; squeeze test and kleiger test unremarkable; Talar dome nontender; No pain at base of 5th MT; No tenderness over cuboid; No tenderness over N spot or navicular prominence No tenderness on posterior aspects of lateral and medial malleolus No sign of peroneal tendon subluxations; Negative tarsal tunnel tinel's Able to walk 4 steps.  X-rays personally reviewed, there does appear to be a corticated defect on the posterior talus.  Impression and Recommendations:    Chronic pain of left  ankle Pain after a fall over 6 weeks ago, persistent pain in spite of bracing, activity modification. Mechanical symptoms on exam, this is all concerning for a talar osteochondral injury with a possible intra-articular loose body. At this point we need an MRI of the ankle, he has failed greater than 6 weeks of current physician directed conservative measures. Continue ASO brace, supportive footwear, and adding Celebrex for pain in the meantime.   ___________________________________________ Ihor Austinhomas J. Benjamin Stainhekkekandam, M.D., ABFM., CAQSM. Primary Care and Sports Medicine [facility]  Adjunct Professor of Family Medicine  [employer]

## 2018-06-22 NOTE — Assessment & Plan Note (Signed)
Pain after a fall over 6 weeks ago, persistent pain in spite of bracing, activity modification. Mechanical symptoms on exam, this is all concerning for a talar osteochondral injury with a possible intra-articular loose body. At this point we need an MRI of the ankle, he has failed greater than 6 weeks of current physician directed conservative measures. Continue ASO brace, supportive footwear, and adding Celebrex for pain in the meantime.

## 2018-07-01 ENCOUNTER — Other Ambulatory Visit: Payer: BLUE CROSS/BLUE SHIELD

## 2018-07-08 ENCOUNTER — Other Ambulatory Visit: Payer: Self-pay

## 2018-07-08 ENCOUNTER — Ambulatory Visit (INDEPENDENT_AMBULATORY_CARE_PROVIDER_SITE_OTHER): Payer: BLUE CROSS/BLUE SHIELD

## 2018-07-08 DIAGNOSIS — G8929 Other chronic pain: Secondary | ICD-10-CM | POA: Diagnosis not present

## 2018-07-08 DIAGNOSIS — M25572 Pain in left ankle and joints of left foot: Secondary | ICD-10-CM

## 2018-07-09 ENCOUNTER — Encounter: Payer: Self-pay | Admitting: Sports Medicine

## 2018-07-13 ENCOUNTER — Encounter: Payer: Self-pay | Admitting: Physician Assistant

## 2018-07-13 ENCOUNTER — Ambulatory Visit: Payer: BLUE CROSS/BLUE SHIELD | Admitting: Physician Assistant

## 2018-07-13 ENCOUNTER — Other Ambulatory Visit: Payer: Self-pay

## 2018-07-13 DIAGNOSIS — F909 Attention-deficit hyperactivity disorder, unspecified type: Secondary | ICD-10-CM

## 2018-07-13 MED ORDER — AMPHETAMINE-DEXTROAMPHETAMINE 10 MG PO TABS: 10.0000 mg | ORAL_TABLET | Freq: Two times a day (BID) | ORAL | 0 refills | Status: DC

## 2018-07-13 NOTE — Progress Notes (Signed)
Patient was supposed to be scheduled for an in office visit today.  He does not have home blood pressure readings or a home blood pressure monitor to provide what is necessary for his follow-up today. He was rescheduled for Thursday June 11. Adderall was refilled

## 2018-07-19 ENCOUNTER — Ambulatory Visit (INDEPENDENT_AMBULATORY_CARE_PROVIDER_SITE_OTHER): Payer: BLUE CROSS/BLUE SHIELD | Admitting: Physician Assistant

## 2018-07-19 ENCOUNTER — Encounter: Payer: Self-pay | Admitting: Physician Assistant

## 2018-07-19 VITALS — BP 143/83 | HR 85 | Temp 98.1°F | Ht 70.0 in | Wt 260.0 lb

## 2018-07-19 DIAGNOSIS — F909 Attention-deficit hyperactivity disorder, unspecified type: Secondary | ICD-10-CM

## 2018-07-19 DIAGNOSIS — I10 Essential (primary) hypertension: Secondary | ICD-10-CM

## 2018-07-19 DIAGNOSIS — Z9189 Other specified personal risk factors, not elsewhere classified: Secondary | ICD-10-CM | POA: Diagnosis not present

## 2018-07-19 MED ORDER — AMLODIPINE BESYLATE 5 MG PO TABS: 10.0000 mg | ORAL_TABLET | Freq: Every day | ORAL | 0 refills | Status: DC

## 2018-07-19 MED ORDER — ATOMOXETINE HCL 40 MG PO CAPS: 40.0000 mg | ORAL_CAPSULE | Freq: Two times a day (BID) | ORAL | 3 refills | Status: DC

## 2018-07-19 NOTE — Progress Notes (Signed)
HPI:                                                                Steve HatterBrandon Bentley is a 33 y.o. male who [facility]: Primary Care Sports Medicine today for ADHD/BP follow-up  HTN: taking Amlodipine 5 mg daily. Compliant with medications. Does not check BP's at home. Denies vision change, headache, chest pain with exertion, orthopnea, lightheadedness, syncope and edema. Risk factors include: male sex, obesity, dyslipidemia  Adult ADD: patient with PMH of childhood ADHD presented in December with complaints of focus issues at work, gradually worsening over the last 5 years. He was started on Straterra and titrated to max dose. He self-discontinued Strattera approx 1 week ago. Reports no improvement in his symptoms with the medication. He was then started on Adderall 10 mg bid. He reports medication is only effective for about 1-1.5 hours. He notes that he feels fatigued after lunch time. Currently drinks 2 large sodas daily.         Adult ADHD Self Report Scale (most recent)          Adult ADHD Self-Report Scale (ASRS-v1.1) Symptom Checklist - 06/15/18 1321                Part A   1. How often do you have trouble wrapping up the final details of a project, once the challenging parts have been done?  (!) Often  2. How often do you have difficulty getting things done in order when you have to do a task that requires organization?  Rarely    3. How often do you have problems remembering appointments or obligations?  Rarely  4. When you have a task that requires a lot of thought, how often do you avoid or delay getting started?  (!) Often    5. How often do you fidget or squirm with your hands or feet when you have to sit down for a long time?  (!) Very Often  6. How often do you feel overly active and compelled to do things, like you were driven by a motor?  Never          Part B   7. How often do you make careless mistakes when you have to work on a  boring or difficult project?  (!) Often  8. How often do you have difficulty keeping your attention when you are doing boring or repetitive work?  (!) Very Often    9. How often do you have difficulty concentrating on what people say to you, even when they are speaking to you directly?  (!) Often  10. How often do you misplace or have difficulty finding things at home or at work?  (!) Often    11. How often are you distracted by activity or noise around you?  (!) Very Often  12. How often do you leave your seat in meetings or other situations in which you are expected to remain seated?  Never    13. How often do you feel restless or fidgety?  Sometimes  14. How often do you have difficulty unwinding and relaxing when you have time to yourself?  (!) Often    15. How often do you find yourself talking  too much when you are in social situations?  Never  16. When you are in a conversation, how often do you find yourself finishing the sentences of the people you are talking to, before they can finish them themselves?  (!) Sometimes    17. How often do you have difficulty waiting your turn in situations when turn taking is required?  Sometimes  18. How often do you interrupt others when they are busy?  (!) Sometimes          Comment   How old were you when these problems first began to occur?  10           Dep[facility] 2/[facility] 06/15/2018 02/02/2018  Decreased Interest 0 1  Down, Depressed, Hopeless 0 1  PHQ - 2 Score 0 2  Altered sleeping 1 3  Tired, decreased energy 2 1  Change in appetite 2 1  Feeling bad or failure about yourself  0 0  Trouble concentrating 3 3  Moving slowly or fidgety/restless 2 3  Suicidal thoughts 0 0  PHQ-9 Score 10 13  Difficult doing work/chores - Extremely dIfficult    GAD 7 : Generalized Anxiety Score 06/15/2018 02/02/2018  Nervous, Anxious, on Edge 0 1  Control/stop worrying 1 0  Worry too much - different things 1 0  Trouble relaxing 0 3   Restless 1 2  Easily annoyed or irritable 1 1  Afraid - awful might happen 0 0  Total GAD 7 Score 4 7  Anxiety Difficulty - Extremely difficult   Results of the Epworth flowsheet 07/19/2018  Sitting and reading 0  Watching TV 0  Sitting, inactive in a public place (e.g. a theatre or a meeting) 1  As a passenger in a car for an hour without a break 1  Lying down to rest in the afternoon when circumstances permit 0  Sitting and talking to someone 0  Sitting quietly after a lunch without alcohol 0  In a car, while stopped for a few minutes in traffic 0  Total score 2      Past Medical History:  Diagnosis Date  . Dyslipidemia 03/09/2018  . GERD (gastroesophageal reflux disease)   . Hypertension   . Obesity    Past Surgical History:  Procedure Laterality Date  . NASAL FRACTURE SURGERY     Social History   Tobacco Use  . Smoking status: Never Smoker  . Smokeless tobacco: Never Used  Substance Use Topics  . Alcohol use: Yes    Alcohol/week: 1.0 standard drinks    Types: 1 Cans of beer per week   family history includes COPD in his mother; Diabetes in his maternal aunt, maternal aunt, and maternal uncle; GER disease in his mother; Glaucoma in his mother; Skin cancer in his maternal uncle.    ROS: negative except as noted in the HPI  Medications: Current Outpatient Medications  Medication Sig Dispense Refill  . amLODipine (NORVASC) 5 MG tablet Take 1 tablet (5 mg total) by mouth daily. 90 tablet 0  . amphetamine-dextroamphetamine (ADDERALL) 10 MG tablet Take 1 tablet (10 mg total) by mouth 2 (two) times daily with breakfast and lunch. 30 tablet 0  . celecoxib (CELEBREX) 200 MG capsule One to 2 tablets by mouth daily as needed for pain. 60 capsule 2  . diphenhydramine-acetaminophen (TYLENOL PM) 25-500 MG TABS tablet Take 1 tablet by mouth at bedtime as needed.    . docusate sodium (COLACE) 100 MG capsule Take 1 capsule (100  mg total) by mouth 2 (two) times daily. 90  capsule 3   No current facility-administered medications for this visit.    Allergies  Allergen Reactions  . Strattera [Atomoxetine] Other (See Comments)    Ineffective for ADHD symptoms       Objective:  BP (!) 145/83   Pulse 85   Ht 5\' 10"  (1.778 m)   Wt 260 lb (117.9 kg)   BMI 37.31 kg/m  Vitals:   07/19/18 1554 07/19/18 1604  BP: (!) 145/83 (!) 143/83  Pulse: 85   Temp: 98.1 F (36.7 C)     Gen:  alert, not ill-appearing, no distress, appropriate for age 75: head normocephalic without obvious abnormality, conjunctiva and cornea clear, trachea midline Pulm: Normal work of breathing, normal phonation, clear to auscultation bilaterally, no wheezes, rales or rhonchi CV: Normal rate, regular rhythm, s1 and s2 distinct, no murmurs, clicks or rubs  Neuro: alert and oriented x 3, no tremor MSK: extremities atraumatic, normal gait and station, trace peripheral edema Skin: intact, no rashes on exposed skin, no jaundice, no cyanosis Psych: well-groomed, cooperative, good eye contact, euthymic mood, affect mood-congruent, speech is articulate, and thought processes clear and goal-directed  BP Readings from Last 3 Encounters:  07/19/18 (!) 143/83  06/22/18 (!) 148/102  03/09/18 (!) 137/91   Pulse Readings from Last 3 Encounters:  07/19/18 85  06/22/18 85  03/09/18 80   Wt Readings from Last 3 Encounters:  07/19/18 260 lb (117.9 kg)  06/22/18 259 lb (117.5 kg)  03/09/18 258 lb (117 kg)     Assessment and Plan: 33 y.o. male with   .Diagnoses and all orders for this visit:  Hypertension goal BP (blood pressure) < 130/80 -     amLODipine (NORVASC) 5 MG tablet; Take 2 tablets (10 mg total) by mouth daily.  Adult ADHD -     atomoxetine (STRATTERA) 40 MG capsule; Take 1 capsule (40 mg total) by mouth 2 (two) times a day.  At risk for obstructive sleep apnea -     Cancel: Home sleep test     BP not controlled STOPBANG - male sex, HTN, BMI>35,  snoring ESS=2 Consider sleep study Increase Amlodipine to 10 mg QD Counseled on therapeutic lifestyle changes Patient to self-monitor and log BP's at home  Straterra monotherapy was not effective for ADHD, but will add back to augment Adderall since BP is not controlled   Patient education and anticipatory guidance given Patient agrees with treatment plan Follow-up in 1 month or sooner as needed if symptoms worsen or fail to improve  Darlyne Russian PA-C

## 2018-07-19 NOTE — Patient Instructions (Signed)
For your blood pressure: - Goal <130/80 (Ideally 120's/70's) - increase Amlodipine to 10 mg daily - take your blood pressure medication in the morning (unless instructed differently) - monitor and log blood pressures at home - check around the same time each day in a relaxed setting - Limit salt to <2500 mg/day - Follow DASH (Dietary Approach to Stopping Hypertension) eating plan - Try to get at least 150 minutes of aerobic exercise per week - Aim to go on a brisk walk 30 minutes per day at least 5 days per week. If you're not active, gradually increase how long you walk by 5 minutes each week - limit alcohol: 2 standard drinks per day for men and 1 per day for women - avoid tobacco/nicotine products. Consider smoking cessation if you smoke - weight loss: 7% of current body weight can reduce your blood pressure by 5-10 points - follow-up at least every 6 months for your blood pressure. Follow-up sooner if your BP is not controlled   DASH Eating Plan DASH stands for "Dietary Approaches to Stop Hypertension." The DASH eating plan is a healthy eating plan that has been shown to reduce high blood pressure (hypertension). It may also reduce your risk for type 2 diabetes, heart disease, and stroke. The DASH eating plan may also help with weight loss. What are tips for following this plan?  General guidelines  Avoid eating more than 2,300 mg (milligrams) of salt (sodium) a day. If you have hypertension, you may need to reduce your sodium intake to 1,500 mg a day.  Limit alcohol intake to no more than 1 drink a day for nonpregnant women and 2 drinks a day for men. One drink equals 12 oz of beer, 5 oz of wine, or 1 oz of hard liquor.  Work with your health care provider to maintain a healthy body weight or to lose weight. Ask what an ideal weight is for you.  Get at least 30 minutes of exercise that causes your heart to beat faster (aerobic exercise) most days of the week. Activities may include  walking, swimming, or biking.  Work with your health care provider or diet and nutrition specialist (dietitian) to adjust your eating plan to your individual calorie needs. Reading food labels   Check food labels for the amount of sodium per serving. Choose foods with less than 5 percent of the Daily Value of sodium. Generally, foods with less than 300 mg of sodium per serving fit into this eating plan.  To find whole grains, look for the word "whole" as the first word in the ingredient list. Shopping  Buy products labeled as "low-sodium" or "no salt added."  Buy fresh foods. Avoid canned foods and premade or frozen meals. Cooking  Avoid adding salt when cooking. Use salt-free seasonings or herbs instead of table salt or sea salt. Check with your health care provider or pharmacist before using salt substitutes.  Do not fry foods. Cook foods using healthy methods such as baking, boiling, grilling, and broiling instead.  Cook with heart-healthy oils, such as olive, canola, soybean, or sunflower oil. Meal planning  Eat a balanced diet that includes: ? 5 or more servings of fruits and vegetables each day. At each meal, try to fill half of your plate with fruits and vegetables. ? Up to 6-8 servings of whole grains each day. ? Less than 6 oz of lean meat, poultry, or fish each day. A 3-oz serving of meat is about the same size as a  deck of cards. One egg equals 1 oz. ? 2 servings of low-fat dairy each day. ? A serving of nuts, seeds, or beans 5 times each week. ? Heart-healthy fats. Healthy fats called Omega-3 fatty acids are found in foods such as flaxseeds and coldwater fish, like sardines, salmon, and mackerel.  Limit how much you eat of the following: ? Canned or prepackaged foods. ? Food that is high in trans fat, such as fried foods. ? Food that is high in saturated fat, such as fatty meat. ? Sweets, desserts, sugary drinks, and other foods with added sugar. ? Full-fat dairy  products.  Do not salt foods before eating.  Try to eat at least 2 vegetarian meals each week.  Eat more home-cooked food and less restaurant, buffet, and fast food.  When eating at a restaurant, ask that your food be prepared with less salt or no salt, if possible. What foods are recommended? The items listed may not be a complete list. Talk with your dietitian about what dietary choices are best for you. Grains Whole-grain or whole-wheat bread. Whole-grain or whole-wheat pasta. Brown rice. [place]. Bulgur. Whole-grain and low-sodium cereals. Pita bread. Low-fat, low-sodium crackers. Whole-wheat flour tortillas. Vegetables Fresh or frozen vegetables (raw, steamed, roasted, or grilled). Low-sodium or reduced-sodium tomato and vegetable juice. Low-sodium or reduced-sodium tomato sauce and tomato paste. Low-sodium or reduced-sodium canned vegetables. Fruits All fresh, dried, or frozen fruit. Canned fruit in natural juice (without added sugar). Meat and other protein foods Skinless chicken or [place]. Ground chicken or [place]. Pork with fat trimmed off. Fish and seafood. Egg whites. Dried beans, peas, or lentils. Unsalted nuts, nut butters, and seeds. Unsalted canned beans. Lean cuts of beef with fat trimmed off. Low-sodium, lean deli meat. Dairy Low-fat (1%) or fat-free (skim) milk. Fat-free, low-fat, or reduced-fat cheeses. Nonfat, low-sodium ricotta or cottage cheese. Low-fat or nonfat yogurt. Low-fat, low-sodium cheese. Fats and oils Soft margarine without trans fats. Vegetable oil. Low-fat, reduced-fat, or light mayonnaise and salad dressings (reduced-sodium). Canola, safflower, olive, soybean, and sunflower oils. Avocado. Seasoning and other foods Herbs. Spices. Seasoning mixes without salt. Unsalted popcorn and pretzels. Fat-free sweets. What foods are not recommended? The items listed may not be a complete list. Talk with your dietitian about what dietary choices are best for  you. Grains Baked goods made with fat, such as croissants, muffins, or some breads. Dry pasta or rice meal packs. Vegetables Creamed or fried vegetables. Vegetables in a cheese sauce. Regular canned vegetables (not low-sodium or reduced-sodium). Regular canned tomato sauce and paste (not low-sodium or reduced-sodium). Regular tomato and vegetable juice (not low-sodium or reduced-sodium). Angie Fava. Olives. Fruits Canned fruit in a light or heavy syrup. Fried fruit. Fruit in cream or butter sauce. Meat and other protein foods Fatty cuts of meat. Ribs. Fried meat. Berniece Salines. Sausage. Bologna and other processed lunch meats. Salami. Fatback. Hotdogs. Bratwurst. Salted nuts and seeds. Canned beans with added salt. Canned or smoked fish. Whole eggs or egg yolks. Chicken or [place] with skin. Dairy Whole or 2% milk, cream, and half-and-half. Whole or full-fat cream cheese. Whole-fat or sweetened yogurt. Full-fat cheese. Nondairy creamers. Whipped toppings. Processed cheese and cheese spreads. Fats and oils Butter. Stick margarine. Lard. Shortening. Ghee. Bacon fat. Tropical oils, such as coconut, palm kernel, or palm oil. Seasoning and other foods Salted popcorn and pretzels. Onion salt, garlic salt, seasoned salt, table salt, and sea salt. Worcestershire sauce. Tartar sauce. Barbecue sauce. Teriyaki sauce. Soy sauce, including reduced-sodium. Steak sauce. Canned  and packaged gravies. Fish sauce. Oyster sauce. Cocktail sauce. Horseradish that you find on the shelf. Ketchup. Mustard. Meat flavorings and tenderizers. Bouillon cubes. Hot sauce and Tabasco sauce. Premade or packaged marinades. Premade or packaged taco seasonings. Relishes. Regular salad dressings. Where to find more information:  National Heart, Lung, and [place]: https://wilson-eaton.com/  American Heart Association: www.heart.org Summary  The DASH eating plan is a healthy eating plan that has been shown to reduce high blood pressure  (hypertension). It may also reduce your risk for type 2 diabetes, heart disease, and stroke.  With the DASH eating plan, you should limit salt (sodium) intake to 2,300 mg a day. If you have hypertension, you may need to reduce your sodium intake to 1,500 mg a day.  When on the DASH eating plan, aim to eat more fresh fruits and vegetables, whole grains, lean proteins, low-fat dairy, and heart-healthy fats.  Work with your health care provider or diet and nutrition specialist (dietitian) to adjust your eating plan to your individual calorie needs. This information is not intended to replace advice given to you by your health care provider. Make sure you discuss any questions you have with your health care provider. Document Released: 01/13/2011 Document Revised: 01/18/2016 Document Reviewed: 01/18/2016 Elsevier Interactive Patient Education  2019 [employer].

## 2018-07-27 ENCOUNTER — Other Ambulatory Visit: Payer: Self-pay | Admitting: Physician Assistant

## 2018-07-27 DIAGNOSIS — F909 Attention-deficit hyperactivity disorder, unspecified type: Secondary | ICD-10-CM

## 2018-07-27 MED ORDER — AMPHETAMINE-DEXTROAMPHETAMINE 10 MG PO TABS: 10.0000 mg | ORAL_TABLET | Freq: Two times a day (BID) | ORAL | 0 refills | Status: DC

## 2018-07-30 ENCOUNTER — Encounter: Payer: Self-pay | Admitting: Physician Assistant

## 2018-07-30 ENCOUNTER — Telehealth (INDEPENDENT_AMBULATORY_CARE_PROVIDER_SITE_OTHER): Payer: BC Managed Care – PPO | Admitting: Physician Assistant

## 2018-07-30 VITALS — BP 134/88 | HR 98

## 2018-07-30 DIAGNOSIS — I10 Essential (primary) hypertension: Secondary | ICD-10-CM

## 2018-07-30 DIAGNOSIS — H1033 Unspecified acute conjunctivitis, bilateral: Secondary | ICD-10-CM | POA: Diagnosis not present

## 2018-07-30 MED ORDER — POLYMYXIN B-TRIMETHOPRIM 10000-0.1 UNIT/ML-% OP SOLN: 1.0000 [drp] | OPHTHALMIC | 0 refills | Status: AC

## 2018-07-30 MED ORDER — AMLODIPINE BESYLATE 10 MG PO TABS: 10.0000 mg | ORAL_TABLET | Freq: Every day | ORAL | 0 refills | Status: DC

## 2018-07-30 NOTE — Telephone Encounter (Signed)
Patient scheduled.

## 2018-07-30 NOTE — Progress Notes (Signed)
Virtual Visit via Video Note  I connected with Steve Bentley on 07/30/18 at  8:10 AM EDT by a video enabled telemedicine application and verified that I am speaking with the correct person using two identifiers.   I discussed the limitations of evaluation and management by telemedicine and the availability of in person appointments. The patient expressed understanding and agreed to proceed.  History of Present Illness: HPI:                                                                Steve Bentley is a 33 y.o. male   CC: eye redness  Onset: yesterday afternoon,  Duration: constant, gradually worsening Character:  Eyes are red, itchy and watery Woke up with eyelid debris described as crusting Intermittent foreign body sensation Eyelids occasionally puffy and sore Evolution: symptoms began in the left eye and quickly spread to the right eye within hours. Denies vision change or photophobia. Denies eye pain. Denies foreign body in the eye or doing any work that requires eye protection. Denies any splashing/chemical exposure Does not wear contact lenses Denies fever, chills, malaise, sore throat, rhinorrhea, otalgia  Also requesting refill of Amlodipine. Has been taking increased dose (10 mg) daily. Denies adverse effects. Home BP range 130's/80's  Past Medical History:  Diagnosis Date  . Dyslipidemia 03/09/2018  . GERD (gastroesophageal reflux disease)   . Hypertension   . Obesity    Past Surgical History:  Procedure Laterality Date  . NASAL FRACTURE SURGERY     Social History   Tobacco Use  . Smoking status: Never Smoker  . Smokeless tobacco: Never Used  Substance Use Topics  . Alcohol use: Yes    Alcohol/week: 1.0 standard drinks    Types: 1 Cans of beer per week   family history includes COPD in his mother; Diabetes in his maternal aunt, maternal aunt, and maternal uncle; GER disease in his mother; Glaucoma in his mother; Skin cancer in his maternal  uncle.    ROS: negative except as noted in the HPI  Medications: Current Outpatient Medications  Medication Sig Dispense Refill  . amLODipine (NORVASC) 10 MG tablet Take 1 tablet (10 mg total) by mouth daily. 90 tablet 0  . amphetamine-dextroamphetamine (ADDERALL) 10 MG tablet Take 1 tablet (10 mg total) by mouth 2 (two) times daily with breakfast and lunch. 30 tablet 0  . atomoxetine (STRATTERA) 40 MG capsule Take 1 capsule (40 mg total) by mouth 2 (two) times a day. 60 capsule 3  . celecoxib (CELEBREX) 200 MG capsule One to 2 tablets by mouth daily as needed for pain. 60 capsule 2  . diphenhydramine-acetaminophen (TYLENOL PM) 25-500 MG TABS tablet Take 1 tablet by mouth at bedtime as needed.    . docusate sodium (COLACE) 100 MG capsule Take 1 capsule (100 mg total) by mouth 2 (two) times daily. 90 capsule 3  . trimethoprim-polymyxin b (POLYTRIM) ophthalmic solution Place 1 drop into both eyes every 4 (four) hours for 7 days. 10 mL 0   No current facility-administered medications for this visit.    Allergies  Allergen Reactions  . Strattera [Atomoxetine] Other (See Comments)    Ineffective for ADHD symptoms       Objective:  BP 134/88   Pulse 98  Gen:  alert, not ill-appearing, no distress, appropriate for age 85: head normocephalic without obvious abnormality, no visible eyelid edema or scleral injection on limited video evaluation Pulm: normal work of breathing, normal phonation  BP Readings from Last 3 Encounters:  07/30/18 134/88  07/19/18 (!) 143/83  06/22/18 (!) 148/102    No results found for this or any previous visit (from the past 72 hour(s)). No results found.    Assessment and Plan: 33 y.o. male with   .Diagnoses and all orders for this visit:  Acute conjunctivitis of both eyes, unspecified acute conjunctivitis type -     trimethoprim-polymyxin b (POLYTRIM) ophthalmic solution; Place 1 drop into both eyes every 4 (four) hours for 7  days.  Hypertension goal BP (blood pressure) < 130/80 -     amLODipine (NORVASC) 10 MG tablet; Take 1 tablet (10 mg total) by mouth daily.    Exam limited due to video visit today No red flag symptoms.  Treating for viral/bacterial conjunctivitis with Zaditor and Polytrim eye drops Counseled on return precautions / indications for Ophth follow-up   Follow Up Instructions:    I discussed the assessment and treatment plan with the patient. The patient was provided an opportunity to ask questions and all were answered. The patient agreed with the plan and demonstrated an understanding of the instructions.   The patient was advised to call back or seek an in-person evaluation if the symptoms worsen or if the condition fails to improve as anticipated.  I provided 10 minutes of non-face-to-face time during this encounter.   Trixie Dredge, [place]

## 2018-08-17 ENCOUNTER — Ambulatory Visit (INDEPENDENT_AMBULATORY_CARE_PROVIDER_SITE_OTHER): Payer: BC Managed Care – PPO | Admitting: Physician Assistant

## 2018-08-17 ENCOUNTER — Encounter: Payer: Self-pay | Admitting: Physician Assistant

## 2018-08-17 VITALS — BP 130/88 | HR 98

## 2018-08-17 DIAGNOSIS — F909 Attention-deficit hyperactivity disorder, unspecified type: Secondary | ICD-10-CM

## 2018-08-17 DIAGNOSIS — R519 Headache, unspecified: Secondary | ICD-10-CM

## 2018-08-17 DIAGNOSIS — I10 Essential (primary) hypertension: Secondary | ICD-10-CM

## 2018-08-17 DIAGNOSIS — R51 Headache: Secondary | ICD-10-CM | POA: Diagnosis not present

## 2018-08-17 DIAGNOSIS — F411 Generalized anxiety disorder: Secondary | ICD-10-CM

## 2018-08-17 MED ORDER — AMPHETAMINE-DEXTROAMPHET ER 10 MG PO CP24: 10.0000 mg | ORAL_CAPSULE | Freq: Every day | ORAL | 0 refills | Status: DC

## 2018-08-17 NOTE — Progress Notes (Signed)
Virtual Visit via Video Note  I connected with Steve Bentley on 08/17/18 at  1:20 PM EDT by a video enabled telemedicine application and verified that I am speaking with the correct person using two identifiers.   I discussed the limitations of evaluation and management by telemedicine and the availability of in person appointments. The patient expressed understanding and agreed to proceed.  History of Present Illness: HPI:                                                                Steve Bentley is a 33 y.o. male   CC: ADHD/HTN follow-up  Adult ADHD: patient with PMH of childhood ADHD presented in December with complaints of focus issues at work, gradually worsening over the last 5 years. He was started on Straterra and titrated to max dose and this was discontinued due to lack of efficacy.  He was then started on Adderall 10 mg bid. He reports medication is only effective for about 1-1.5 hours. He was re-started on Straterra 40 mg in combination with stimulant, due to elevated  BP and co-morbid . Patient reports he discontinued Strattera again after 3 days due to nausea.  HTN - taking Amlodipine 10 mg daily for approx 1-2 weeks, he self-titrated from 5 mg.  136/86 132/74 134/92 122/90 122/92 138/86 He does endorse approx 2 mild headaches per week that resolve with Ibuprofen. Denies vision change, chest painwith exertion, orthopnea, lightheadedness,syncopeand edema.  Risk factors include:male sex, obesity, dyslipidemia  Dep[facility] screen [facility] 2/9 08/17/2018 06/15/2018 02/02/2018  Decreased Interest 0 0 1  Down, Depressed, Hopeless 0 0 1  PHQ - 2 Score 0 0 2  Altered sleeping 1 1 3   Tired, decreased energy 2 2 1   Change in appetite 2 2 1   Feeling bad or failure about yourself  1 0 0  Trouble concentrating 2 3 3   Moving slowly or fidgety/restless 0 2 3  Suicidal thoughts 0 0 0  PHQ-9 Score 8 10 13   Difficult doing work/chores - - Extremely dIfficult    GAD 7 : Generalized  Anxiety Score 08/17/2018 06/15/2018 02/02/2018  Nervous, Anxious, on Edge 0 0 1  Control/stop worrying 1 1 0  Worry too much - different things 2 1 0  Trouble relaxing 1 0 3  Restless 1 1 2   Easily annoyed or irritable 1 1 1   Afraid - awful might happen 1 0 0  Total GAD 7 Score 7 4 7   Anxiety Difficulty - - Extremely difficult      Past Medical History:  Diagnosis Date  . Dyslipidemia 03/09/2018  . GERD (gastroesophageal reflux disease)   . Hypertension   . Obesity    Past Surgical History:  Procedure Laterality Date  . NASAL FRACTURE SURGERY     Social History   Tobacco Use  . Smoking status: Never Smoker  . Smokeless tobacco: Never Used  Substance Use Topics  . Alcohol use: Yes    Alcohol/week: 1.0 standard drinks    Types: 1 Cans of beer per week   family history includes COPD in his mother; Diabetes in his maternal aunt, maternal aunt, and maternal uncle; GER disease in his mother; Glaucoma in his mother; Skin cancer in his maternal uncle.    ROS: negative except  as noted in the HPI  Medications: Current Outpatient Medications  Medication Sig Dispense Refill  . amLODipine (NORVASC) 10 MG tablet Take 1 tablet (10 mg total) by mouth daily. 90 tablet 0  . diphenhydramine-acetaminophen (TYLENOL PM) 25-500 MG TABS tablet Take 1 tablet by mouth at bedtime as needed.    . docusate sodium (COLACE) 100 MG capsule Take 1 capsule (100 mg total) by mouth 2 (two) times daily. 90 capsule 3  . amphetamine-dextroamphetamine (ADDERALL XR) 10 MG 24 hr capsule Take 1 capsule (10 mg total) by mouth daily. 30 capsule 0   No current facility-administered medications for this visit.    Allergies  Allergen Reactions  . Strattera [Atomoxetine] Other (See Comments)    Ineffective for ADHD symptoms       Objective:  BP 130/88   Pulse 98   BP Readings from Last 3 Encounters:  08/17/18 130/88  07/30/18 134/88  07/19/18 (!) 143/83   Pulse Readings from Last 3 Encounters:   08/17/18 98  07/30/18 98  07/19/18 85   Gen:  alert, not ill-appearing, no distress, appropriate for age 38: head normocephalic without obvious abnormality, conjunctiva and cornea clear, trachea midline Pulm: Normal work of breathing, normal phonation Neuro: alert and oriented x 3 Psych: cooperative, euthymic mood, affect mood-congruent, speech is articulate, normal rate and volume; thought processes clear and goal-directed, normal judgment, good insight  Lab Results  Component Value Date   CREATININE 1.04 02/02/2018   BUN 10 02/02/2018   NA 138 02/02/2018   K 4.1 02/02/2018   CL 101 02/02/2018   CO2 28 02/02/2018      Assessment and Plan: 33 y.o. male with   .Steve Bentley was seen today for medication management.  Diagnoses and all orders for this visit:  Adult ADHD -     amphetamine-dextroamphetamine (ADDERALL XR) 10 MG 24 hr capsule; Take 1 capsule (10 mg total) by mouth daily.  GAD (generalized anxiety disorder)  Hypertension goal BP (blood pressure) < 130/80  Acute nonintractable headache, unspecified headache type  Home BP's are still in stage 1 hypertensive range, diastolic occasionally above 90 Headaches possibly due to AE of Adderall or uncontrolled BP Switching from immediate release to XR due to elevated BP and headache He did not tolerate Straterra. Wellbutrin is also likely to raise BP and cause headache.  Still recommend not exceeding 10 mg of stimulant Patient counseled to avoid NSAIDs, oral decongestants, and caffeine  Cont Amlodipine 10 mg Continue to monitor and log BP's at home. Send MyChart message with home BP readings in 1 week  Follow-up in office in 1 month  Follow Up Instructions:    I discussed the assessment and treatment plan with the patient. The patient was provided an opportunity to ask questions and all were answered. The patient agreed with the plan and demonstrated an understanding of the instructions.   The patient was advised  to call back or seek an in-person evaluation if the symptoms worsen or if the condition fails to improve as anticipated.  I provided 10 minutes of non-face-to-face time during this encounter.   Trixie Dredge, [place]

## 2018-09-04 ENCOUNTER — Encounter: Payer: Self-pay | Admitting: Physician Assistant

## 2018-10-17 ENCOUNTER — Encounter: Payer: Self-pay | Admitting: Physician Assistant

## 2018-10-17 ENCOUNTER — Other Ambulatory Visit: Payer: Self-pay | Admitting: Physician Assistant

## 2018-10-17 DIAGNOSIS — F909 Attention-deficit hyperactivity disorder, unspecified type: Secondary | ICD-10-CM

## 2018-10-18 NOTE — Telephone Encounter (Signed)
Last RF sent 08/17/18  Last OV 08/17/18  RX pended

## 2018-10-19 ENCOUNTER — Other Ambulatory Visit: Payer: Self-pay | Admitting: Physician Assistant

## 2018-10-19 DIAGNOSIS — I10 Essential (primary) hypertension: Secondary | ICD-10-CM

## 2018-10-19 MED ORDER — AMPHETAMINE-DEXTROAMPHET ER 10 MG PO CP24: 10.0000 mg | ORAL_CAPSULE | Freq: Every day | ORAL | 0 refills | Status: DC

## 2018-10-22 MED ORDER — AMLODIPINE BESYLATE 10 MG PO TABS: 10.0000 mg | ORAL_TABLET | Freq: Every day | ORAL | 0 refills | Status: DC

## 2018-11-15 ENCOUNTER — Encounter: Payer: Self-pay | Admitting: Physician Assistant

## 2018-11-15 ENCOUNTER — Other Ambulatory Visit: Payer: Self-pay | Admitting: Physician Assistant

## 2018-11-15 ENCOUNTER — Telehealth: Payer: Self-pay | Admitting: Physician Assistant

## 2018-11-15 DIAGNOSIS — F909 Attention-deficit hyperactivity disorder, unspecified type: Secondary | ICD-10-CM

## 2018-11-15 NOTE — Telephone Encounter (Signed)
This has been routed to covering Provider for review in separate MyChart message

## 2018-11-15 NOTE — Telephone Encounter (Signed)
Steve Bentley called this afternoon to schedule an appt for his med refill. He took his last adderall today. [facility] in [place] on [address] is the pharmacy he uses.

## 2018-11-16 MED ORDER — AMPHETAMINE-DEXTROAMPHET ER 10 MG PO CP24: 10.0000 mg | ORAL_CAPSULE | Freq: Every day | ORAL | 0 refills | Status: DC

## 2018-11-16 NOTE — Telephone Encounter (Signed)
It looks like Dr. Georgina Snell has taken care of this

## 2018-12-03 ENCOUNTER — Encounter: Payer: Self-pay | Admitting: Osteopathic Medicine

## 2018-12-03 ENCOUNTER — Ambulatory Visit (INDEPENDENT_AMBULATORY_CARE_PROVIDER_SITE_OTHER): Payer: Managed Care, Other (non HMO) | Admitting: Osteopathic Medicine

## 2018-12-03 ENCOUNTER — Other Ambulatory Visit: Payer: Self-pay

## 2018-12-03 VITALS — BP 147/97 | HR 90 | Temp 98.2°F | Wt 262.0 lb

## 2018-12-03 DIAGNOSIS — I10 Essential (primary) hypertension: Secondary | ICD-10-CM

## 2018-12-03 DIAGNOSIS — Z23 Encounter for immunization: Secondary | ICD-10-CM | POA: Diagnosis not present

## 2018-12-03 DIAGNOSIS — F909 Attention-deficit hyperactivity disorder, unspecified type: Secondary | ICD-10-CM | POA: Diagnosis not present

## 2018-12-03 MED ORDER — VALSARTAN 160 MG PO TABS: 160.0000 mg | ORAL_TABLET | Freq: Every day | ORAL | 1 refills | Status: DC

## 2018-12-03 MED ORDER — AMPHETAMINE-DEXTROAMPHET ER 10 MG PO CP24: 10.0000 mg | ORAL_CAPSULE | Freq: Every day | ORAL | 0 refills | Status: DC

## 2018-12-03 NOTE — Progress Notes (Signed)
HPI: Steve Bentley is a 33 y.o. male who  has a past medical history of Dyslipidemia (03/09/2018), GERD (gastroesophageal reflux disease), Hypertension, and Obesity.  he presents to [facility] today, 12/03/18,  for chief complaint of:  ADD f/u HTN f/u   No concerns at this time ADD well controlled on current meds HTN a bit above goal today  BP Readings from Last 3 Encounters:  12/03/18 (!) 147/97  08/17/18 130/88  07/30/18 134/88        At today's visit 12/03/18 ... PMH, PSH, FH reviewed and updated as needed.  Current medication list and allergy/intolerance hx reviewed and updated as needed. (See remainder of HPI, ROS, Phys Exam below)   No results found.  No results found for this or any previous visit (from the past 72 hour(s)).        ASSESSMENT/PLAN: The primary encounter diagnosis was Adult ADHD. Diagnoses of Need for influenza vaccination and Hypertension goal BP (blood pressure) < 130/80 were also pertinent to this visit.  Trial add Valsartan to the Amlodipine  Might be able to do combo pill of these meds if BP to goal  Potential AE reviewed    Orders Placed This Encounter  Procedures  . Flu Vaccine QUAD 6+ mos PF IM (Fluarix Quad PF)     Meds ordered this encounter  Medications  . amphetamine-dextroamphetamine (ADDERALL XR) 10 MG 24 hr capsule    Sig: Take 1 capsule (10 mg total) by mouth daily.    Dispense:  30 capsule    Refill:  0  . amphetamine-dextroamphetamine (ADDERALL XR) 10 MG 24 hr capsule    Sig: Take 1 capsule (10 mg total) by mouth daily.    Dispense:  30 capsule    Refill:  0  . amphetamine-dextroamphetamine (ADDERALL XR) 10 MG 24 hr capsule    Sig: Take 1 capsule (10 mg total) by mouth daily.    Dispense:  30 capsule    Refill:  0  . valsartan (DIOVAN) 160 MG tablet    Sig: Take 1 tablet (160 mg total) by mouth daily.    Dispense:  30 tablet    Refill:  1      Follow-up plan: Return in  about 2 weeks (around 12/17/2018) for recheck BP on new medications.                                                 ################################################# ################################################# ################################################# #################################################    Current Meds  Medication Sig  . amLODipine (NORVASC) 10 MG tablet Take 1 tablet (10 mg total) by mouth daily.  Derrill Memo ON 12/16/2018] amphetamine-dextroamphetamine (ADDERALL XR) 10 MG 24 hr capsule Take 1 capsule (10 mg total) by mouth daily.  . diphenhydramine-acetaminophen (TYLENOL PM) 25-500 MG TABS tablet Take 1 tablet by mouth at bedtime as needed.  . docusate sodium (COLACE) 100 MG capsule Take 1 capsule (100 mg total) by mouth 2 (two) times daily.  . [DISCONTINUED] amphetamine-dextroamphetamine (ADDERALL XR) 10 MG 24 hr capsule Take 1 capsule (10 mg total) by mouth daily.    Allergies  Allergen Reactions  . Strattera [Atomoxetine] Other (See Comments)    Ineffective for ADHD symptoms       Review of Systems:  Constitutional: No recent illness  HEENT: No  headache, no vision change  Cardiac: No  chest pain, No  pressure, No palpitations  Respiratory:  No  shortness of breath. No  Cough  Gastrointestinal: No  abdominal pain  Musculoskeletal: No new myalgia/arthralgia  Skin: No  Rash  Neurologic: No  weakness, No  Dizziness  Psychiatric: No  concerns with depression, No  concerns with anxiety  Exam:  BP (!) 147/97 (BP Location: Left Arm, Patient Position: Sitting, Cuff Size: Large)   Pulse 90   Temp 98.2 F (36.8 C) (Oral)   Wt 262 lb 0.6 oz (118.9 kg)   BMI 37.60 kg/m   Constitutional: VS see above. General Appearance: alert, well-developed, well-nourished, NAD  Eyes: Normal lids and conjunctive, non-icteric sclera  Neck: No masses, trachea midline.   Respiratory: Normal respiratory effort.  no wheeze, no rhonchi, no rales  Cardiovascular: S1/S2 normal, no murmur, no rub/gallop auscultated. RRR.   Musculoskeletal: Gait normal. Symmetric and independent movement of all extremities  Neurological: Normal balance/coordination. No tremor.  Skin: warm, dry, intact.   Psychiatric: Normal judgment/insight. Normal mood and affect. Oriented x3.       Visit summary with medication list and pertinent instructions was printed for patient to review, patient was advised to alert [place] if any updates are needed. All questions at time of visit were answered - patient instructed to contact office with any additional concerns. ER/RTC precautions were reviewed with the patient and understanding verbalized.     Please note: voice recognition software was used to produce this document, and typos may escape review. Please contact Dr. Lyn Hollingshead for any needed clarifications.    Follow up plan: Return in about 2 weeks (around 12/17/2018) for recheck BP on new medications.

## 2018-12-14 ENCOUNTER — Encounter: Payer: Self-pay | Admitting: Osteopathic Medicine

## 2018-12-17 ENCOUNTER — Other Ambulatory Visit: Payer: Self-pay | Admitting: Osteopathic Medicine

## 2018-12-17 DIAGNOSIS — F909 Attention-deficit hyperactivity disorder, unspecified type: Secondary | ICD-10-CM

## 2018-12-18 MED ORDER — AMPHETAMINE-DEXTROAMPHET ER 10 MG PO CP24: 10.0000 mg | ORAL_CAPSULE | Freq: Every day | ORAL | 0 refills | Status: DC

## 2018-12-18 NOTE — Telephone Encounter (Signed)
[facility] requesting med refill for adderall 10 mg.

## 2019-01-07 ENCOUNTER — Ambulatory Visit: Payer: Managed Care, Other (non HMO) | Admitting: Osteopathic Medicine

## 2019-01-19 ENCOUNTER — Other Ambulatory Visit: Payer: Self-pay | Admitting: Osteopathic Medicine

## 2019-01-19 DIAGNOSIS — F909 Attention-deficit hyperactivity disorder, unspecified type: Secondary | ICD-10-CM

## 2019-01-21 ENCOUNTER — Other Ambulatory Visit: Payer: Self-pay | Admitting: Osteopathic Medicine

## 2019-01-21 ENCOUNTER — Telehealth: Payer: Self-pay | Admitting: Osteopathic Medicine

## 2019-01-21 DIAGNOSIS — F909 Attention-deficit hyperactivity disorder, unspecified type: Secondary | ICD-10-CM

## 2019-01-21 NOTE — Telephone Encounter (Signed)
Patient called and reports that today is his last day of quarantine. He has a form that his job need him to fill out so that he can try return to work tomorrow. I tried to explain that he would need a visit and he did not want to hear that. He just wants you to fill out a form so he can go back to work. Please advise.

## 2019-01-21 NOTE — Telephone Encounter (Signed)
Doesn't need visit but if there is a form id need to see it or if he needs a letter let m eknow

## 2019-01-21 NOTE — Telephone Encounter (Signed)
Pt has been updated and advised to check with his pharmacy. Pt did mentioned he will need to drop off a clearance form for work. Pt was on quarantine and is due to return back to work on 01/22/19. Pt instructed of clinic's policy regarding form completion. Aware that form will not be completed on the same day. No other inquiries during call.

## 2019-01-21 NOTE — Telephone Encounter (Signed)
Rx should be on file at pharmacy for this month and for next please see Rx list thanks pt needs to call pharmacy

## 2019-01-23 ENCOUNTER — Other Ambulatory Visit: Payer: Self-pay | Admitting: Neurology

## 2019-01-23 DIAGNOSIS — I10 Essential (primary) hypertension: Secondary | ICD-10-CM

## 2019-01-23 MED ORDER — AMLODIPINE BESYLATE 10 MG PO TABS: 10.0000 mg | ORAL_TABLET | Freq: Every day | ORAL | 0 refills | Status: DC

## 2019-01-24 NOTE — Telephone Encounter (Signed)
Sent pt MyChart msg.

## 2019-02-04 ENCOUNTER — Other Ambulatory Visit: Payer: Self-pay

## 2019-02-04 MED ORDER — VALSARTAN 160 MG PO TABS: 160.0000 mg | ORAL_TABLET | Freq: Every day | ORAL | 0 refills | Status: DC

## 2019-04-22 ENCOUNTER — Other Ambulatory Visit: Payer: Self-pay

## 2019-04-22 DIAGNOSIS — I10 Essential (primary) hypertension: Secondary | ICD-10-CM

## 2019-04-22 MED ORDER — AMLODIPINE BESYLATE 10 MG PO TABS: 10.0000 mg | ORAL_TABLET | Freq: Every day | ORAL | 0 refills | Status: DC

## 2019-09-27 ENCOUNTER — Ambulatory Visit (INDEPENDENT_AMBULATORY_CARE_PROVIDER_SITE_OTHER): Payer: Managed Care, Other (non HMO) | Admitting: Osteopathic Medicine

## 2019-09-27 ENCOUNTER — Encounter: Payer: Self-pay | Admitting: Osteopathic Medicine

## 2019-09-27 ENCOUNTER — Other Ambulatory Visit: Payer: Self-pay

## 2019-09-27 VITALS — BP 135/82 | HR 59 | Wt 267.0 lb

## 2019-09-27 DIAGNOSIS — R030 Elevated blood-pressure reading, without diagnosis of hypertension: Secondary | ICD-10-CM

## 2019-09-27 DIAGNOSIS — Z23 Encounter for immunization: Secondary | ICD-10-CM | POA: Diagnosis not present

## 2019-09-27 DIAGNOSIS — F909 Attention-deficit hyperactivity disorder, unspecified type: Secondary | ICD-10-CM | POA: Diagnosis not present

## 2019-09-27 LAB — COMPLETE METABOLIC PANEL WITH GFR
AG Ratio: 1.6 (calc) (ref 1.0–2.5)
ALT: 38 U/L (ref 9–46)
AST: 22 U/L (ref 10–40)
Albumin: 4.4 g/dL (ref 3.6–5.1)
Alkaline phosphatase (APISO): 56 U/L (ref 36–130)
BUN: 12 mg/dL (ref 7–25)
CO2: 27 mmol/L (ref 20–32)
Calcium: 9.6 mg/dL (ref 8.6–10.3)
Chloride: 104 mmol/L (ref 98–110)
Creat: 1.14 mg/dL (ref 0.60–1.35)
GFR, Est African American: 98 mL/min/{1.73_m2} (ref >=60)
GFR, Est Non African American: 85 mL/min/{1.73_m2} (ref >=60)
Globulin: 2.7 g/dL (calc) (ref 1.9–3.7)
Glucose, Bld: 103 mg/dL — ABNORMAL HIGH (ref 65–99)
Potassium: 4.7 mmol/L (ref 3.5–5.3)
Sodium: 138 mmol/L (ref 135–146)
Total Bilirubin: 0.4 mg/dL (ref 0.2–1.2)
Total Protein: 7.1 g/dL (ref 6.1–8.1)

## 2019-09-27 LAB — LIPID PANEL
Cholesterol: 178 mg/dL (ref <200)
HDL: 36 mg/dL — ABNORMAL LOW (ref >=40)
LDL Cholesterol (Calc): 119 mg/dL (calc) — ABNORMAL HIGH
Non-HDL Cholesterol (Calc): 142 mg/dL (calc) — ABNORMAL HIGH (ref <130)
Total CHOL/HDL Ratio: 4.9 (calc) (ref <5.0)
Triglycerides: 122 mg/dL (ref <150)

## 2019-09-27 LAB — CBC
HCT: 46.6 % (ref 38.5–50.0)
Hemoglobin: 15.9 g/dL (ref 13.2–17.1)
MCH: 29.8 pg (ref 27.0–33.0)
MCHC: 34.1 g/dL (ref 32.0–36.0)
MCV: 87.3 fL (ref 80.0–100.0)
MPV: 9.6 fL (ref 7.5–12.5)
Platelets: 264 10*3/uL (ref 140–400)
RBC: 5.34 10*6/uL (ref 4.20–5.80)
RDW: 12.9 % (ref 11.0–15.0)
WBC: 5.4 10*3/uL (ref 3.8–10.8)

## 2019-09-27 MED ORDER — AMPHETAMINE-DEXTROAMPHET ER 10 MG PO CP24: 10.0000 mg | ORAL_CAPSULE | Freq: Every day | ORAL | 0 refills | Status: AC

## 2019-09-27 NOTE — Patient Instructions (Addendum)
Goal BP 130 top number or less, 80 bottom number or less  If BP above goal back on the Adderall, will restart valsartan BP Rx  Labs today!

## 2019-09-27 NOTE — Progress Notes (Signed)
Steve Bentley is a 34 y.o. male who presents to  [facility]  today, 09/27/19, seeking care for the following:  . Restart ADHD Rx  Monitor BP, has been off Rx since 12/2018, home BP have been reportedly ok, around 130/80    ASSESSMENT & PLAN with other pertinent findings:  The primary encounter diagnosis was Adult ADHD. Diagnoses of Need for immunization against influenza and Elevated blood pressure reading were also pertinent to this visit.   No results found for this or any previous visit (from the past 24 hour(s)).  Wt Readings from Last 3 Encounters:  09/27/19 267 lb (121.1 kg)  12/03/18 262 lb 0.6 oz (118.9 kg)  07/19/18 260 lb (117.9 kg)   BP Readings from Last 3 Encounters:  09/27/19 135/82  12/03/18 (!) 147/97  08/17/18 130/88   BP a bit above goal here   Patient Instructions  Goal BP 130 top number or less, 80 bottom number or less  If BP above goal back on the Adderall, will restart valsartan BP Rx  Labs today!    Orders Placed This Encounter  Procedures  . Flu Vaccine QUAD 36+ mos IM  . CBC  . COMPLETE METABOLIC PANEL WITH GFR  . Lipid panel    Meds ordered this encounter  Medications  . amphetamine-dextroamphetamine (ADDERALL XR) 10 MG 24 hr capsule    Sig: Take 1 capsule (10 mg total) by mouth daily.    Dispense:  30 capsule    Refill:  0  . amphetamine-dextroamphetamine (ADDERALL XR) 10 MG 24 hr capsule    Sig: Take 1 capsule (10 mg total) by mouth daily.    Dispense:  30 capsule    Refill:  0  . amphetamine-dextroamphetamine (ADDERALL XR) 10 MG 24 hr capsule    Sig: Take 1 capsule (10 mg total) by mouth daily.    Dispense:  30 capsule    Refill:  0       Follow-up instructions: Return in about 3 months (around 12/28/2019) for refill adhd Rx and follow BP, otherwise see me sooner if needed / based on lab results  .                                         BP 135/82   Pulse (!) 59   Wt 267 lb (121.1 kg)   SpO2 100%   BMI 38.31 kg/m   Current Meds  Medication Sig  . omeprazole (PRILOSEC) 40 MG capsule Take 40 mg by mouth daily.    No results found for this or any previous visit (from the past 72 hour(s)).  No results found.     All questions at time of visit were answered - patient instructed to contact office with any additional concerns or updates.  ER/RTC precautions were reviewed with the patient as applicable.   Please note: voice recognition software was used to produce this document, and typos may escape review. Please contact Dr. Lyn Hollingshead for any needed clarifications.

## 2019-10-01 ENCOUNTER — Encounter: Payer: Self-pay | Admitting: Osteopathic Medicine

## 2019-10-21 ENCOUNTER — Telehealth (INDEPENDENT_AMBULATORY_CARE_PROVIDER_SITE_OTHER): Payer: Managed Care, Other (non HMO) | Admitting: Medical-Surgical

## 2019-10-21 ENCOUNTER — Encounter: Payer: Self-pay | Admitting: Medical-Surgical

## 2019-10-21 DIAGNOSIS — L03213 Periorbital cellulitis: Secondary | ICD-10-CM

## 2019-10-21 MED ORDER — CEFPODOXIME PROXETIL 200 MG PO TABS: 200.0000 mg | ORAL_TABLET | Freq: Two times a day (BID) | ORAL | 0 refills | Status: AC

## 2019-10-21 MED ORDER — SULFAMETHOXAZOLE-TRIMETHOPRIM 800-160 MG PO TABS: 1.0000 | ORAL_TABLET | Freq: Two times a day (BID) | ORAL | 0 refills | Status: AC

## 2019-10-21 NOTE — Progress Notes (Signed)
Virtual Visit via Video Note  I connected with Steve Bentley on 10/21/19 at  4:00 PM EDT by a video enabled telemedicine application and verified that I am speaking with the correct person using two identifiers.   I discussed the limitations of evaluation and management by telemedicine and the availability of in person appointments. The patient expressed understanding and agreed to proceed.  Patient location: home Provider locations: office  Subjective:    CC: eye swelling  HPI: Pleasant 34 year old male presenting via MyChart video visit for eye swelling and pain.  Reports he noted a stye on the right eye that popped approximately 2 days ago.  He has been using warm compresses intermittently but notes that the swelling, redness, and discomfort has not improved.  Now the right upper and lower eyelids are swollen almost shut.  He is able to hold the eyelids apart and notes that his vision in the right eye is very blurry.  Has had headache for the past couple of days.  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  Endorses small amounts of white drainage from the eye.  Denies fevers, chills, worsened pain with EOM.  Has not tried any medications or OTC remedies.  Past medical history, Surgical history, Family history not pertinant except as noted below, Social history, Allergies, and medications have been entered into the medical record, reviewed, and corrections made.   Review of Systems: See HPI for pertinent positives and negatives.   Objective:    General: Speaking clearly in complete sentences without any shortness of breath.  Alert and oriented x3.  Normal judgment. No apparent acute distress.      Impression and Recommendations:    1. Preseptal cellulitis of right eye Starting Bactrim DS 1 tab BID and Vantin 200mg  BID x 7 days. Monitor eye for worsening pain, drainage, or worsening blurry  vision, and if any of that presents, seek emergency care overnight. Work note provided via for 9/14 and 9/15. Plan to reevaluate symptoms on 10/22/2019 for improvement.   Return in about 1 day (around 10/22/2019) for eye infection follow up.   20 minutes of non face-to-face time was provided during this encounter.  I discussed the assessment and treatment plan with the patient. The patient was provided an opportunity to ask questions and all were answered. The patient agreed with the plan and demonstrated an understanding of the instructions.   The patient was advised to call back or seek an in-person evaluation if the symptoms worsen or if the condition fails to improve as anticipated.  10/24/2019, DNP, APRN, FNP-BC [facility]

## 2019-10-22 ENCOUNTER — Encounter: Payer: Self-pay | Admitting: Medical-Surgical

## 2019-10-22 ENCOUNTER — Telehealth (INDEPENDENT_AMBULATORY_CARE_PROVIDER_SITE_OTHER): Payer: Managed Care, Other (non HMO) | Admitting: Medical-Surgical

## 2019-10-22 DIAGNOSIS — L03213 Periorbital cellulitis: Secondary | ICD-10-CM | POA: Diagnosis not present

## 2019-10-22 NOTE — Progress Notes (Signed)
Virtual Visit via Video Note  I connected with Steve Bentley on 10/22/19 at  4:00 PM EDT by a video enabled telemedicine application and verified that I am speaking with the correct person using two identifiers.   I discussed the limitations of evaluation and management by telemedicine and the availability of in person appointments. The patient expressed understanding and agreed to proceed.  Patient location: home Provider locations: office  Subjective:    CC: 24 hour check on eye swelling  HPI: Pleasant 34 year old male presenting via MyChart video visit for 24 hour recheck on the eye swelling and redness that was determined to be preseptal cellulitis yesterday. He was able to start both Bactrim and Vantin last night. Tolerating both medications well without side effects. Notes his eye swelling has reduced and it is not as red as it was. He is still having drainage from the eye/eyelid but his vision is slightly better. No pain with EOM or over the swollen eye lid. Denies fever, chills, headaches, dizziness.  Past medical history, Surgical history, Family history not pertinant except as noted below, Social history, Allergies, and medications have been entered into the medical record, reviewed, and corrections made.   Review of Systems: See HPI for pertinent positives and negatives.   Objective:    General: Speaking clearly in complete sentences without any shortness of breath.  Alert and oriented x3.  Normal judgment. No apparent acute distress.    Impression and Recommendations:    1. Preseptal cellulitis of right eye Improvement in symptoms after 24 hours of antibiotics. Continue both antibiotics as prescribed. Continue warm compresses. Monitor for vision loss or worsening of symptoms.   Return if symptoms worsen or fail to improve.  15 minutes of non-face-to-face time was provided during this encounter.  I discussed the assessment and treatment plan with the patient. The  patient was provided an opportunity to ask questions and all were answered. The patient agreed with the plan and demonstrated an understanding of the instructions.   The patient was advised to call back or seek an in-person evaluation if the symptoms worsen or if the condition fails to improve as anticipated.   Thayer Ohm, DNP, APRN, FNP-BC [facility]

## 2019-10-27 ENCOUNTER — Other Ambulatory Visit: Payer: Self-pay | Admitting: Osteopathic Medicine

## 2019-10-29 ENCOUNTER — Encounter: Payer: Self-pay | Admitting: Osteopathic Medicine

## 2019-12-27 ENCOUNTER — Ambulatory Visit: Payer: Managed Care, Other (non HMO) | Admitting: Osteopathic Medicine

## 2020-09-26 IMAGING — DX LEFT ANKLE COMPLETE - 3+ VIEW
3 series · 3 of 3 positions shown · non-contrast
Comparison: None.

CLINICAL DATA: Fall, pain

EXAM:
LEFT ANKLE COMPLETE - 3+ VIEW

[ankle ap]
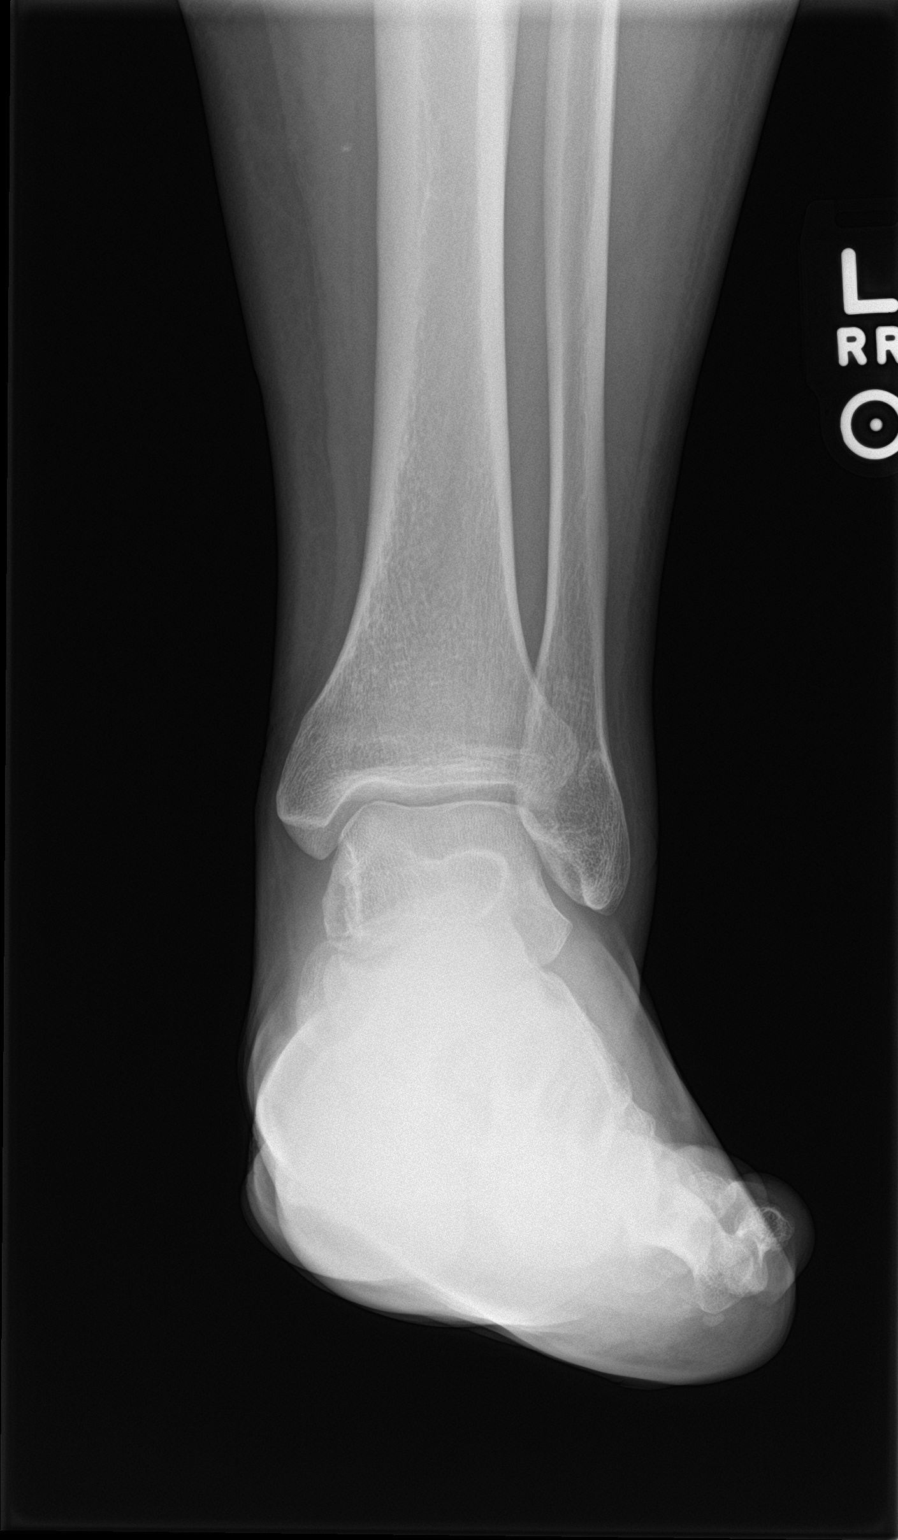

[ankle obl]
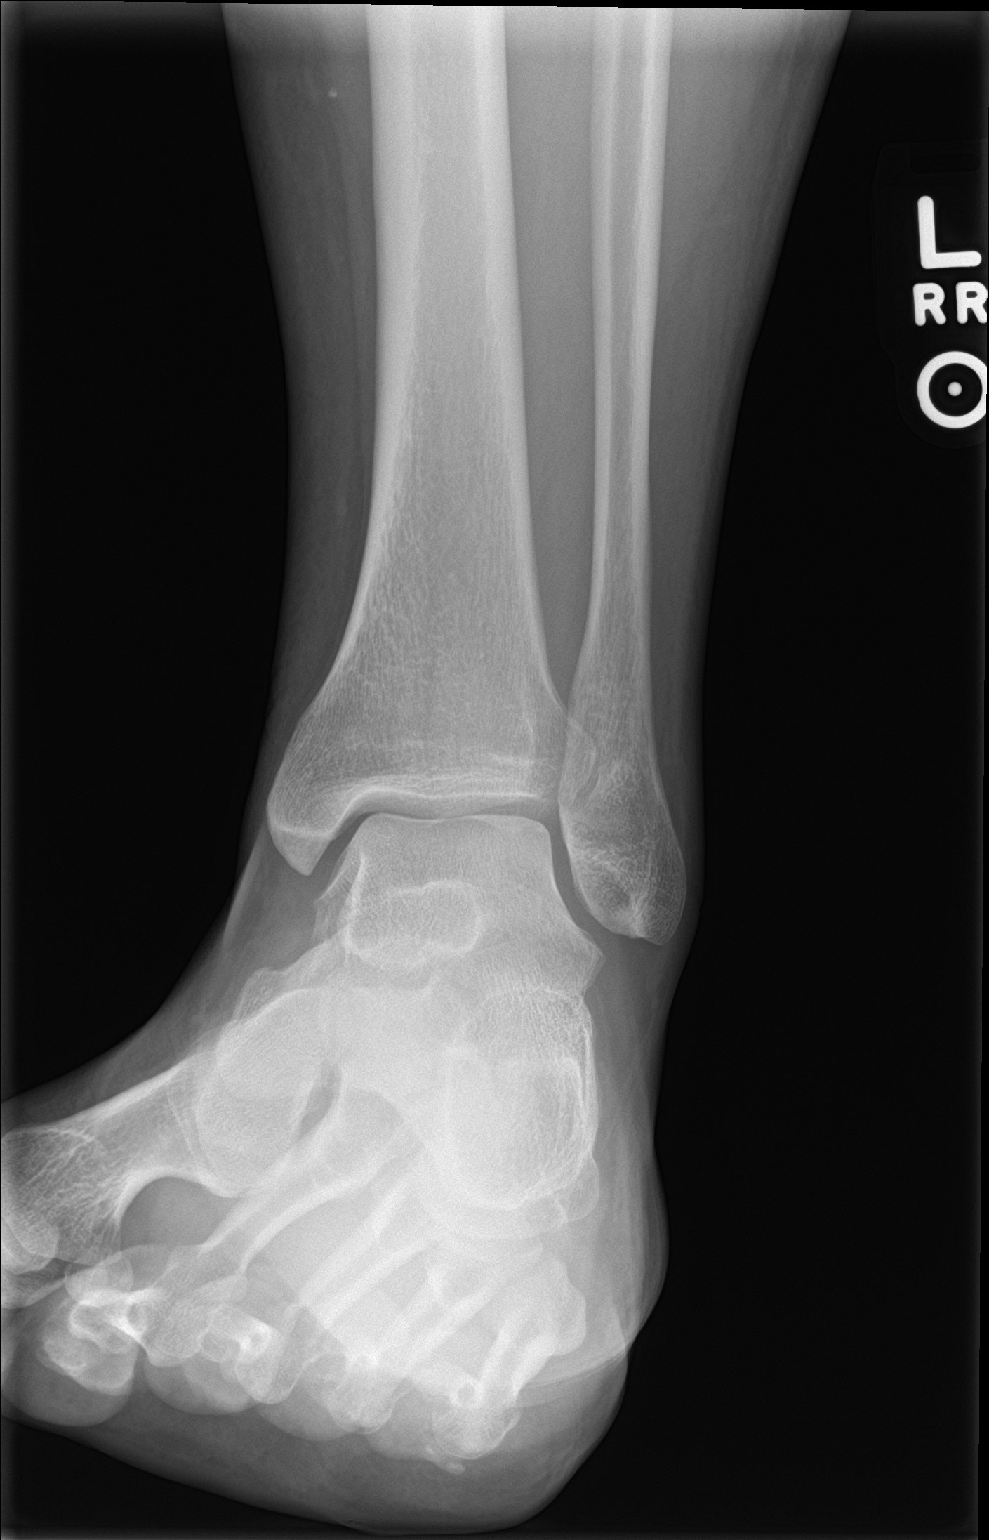

[ankle lat]
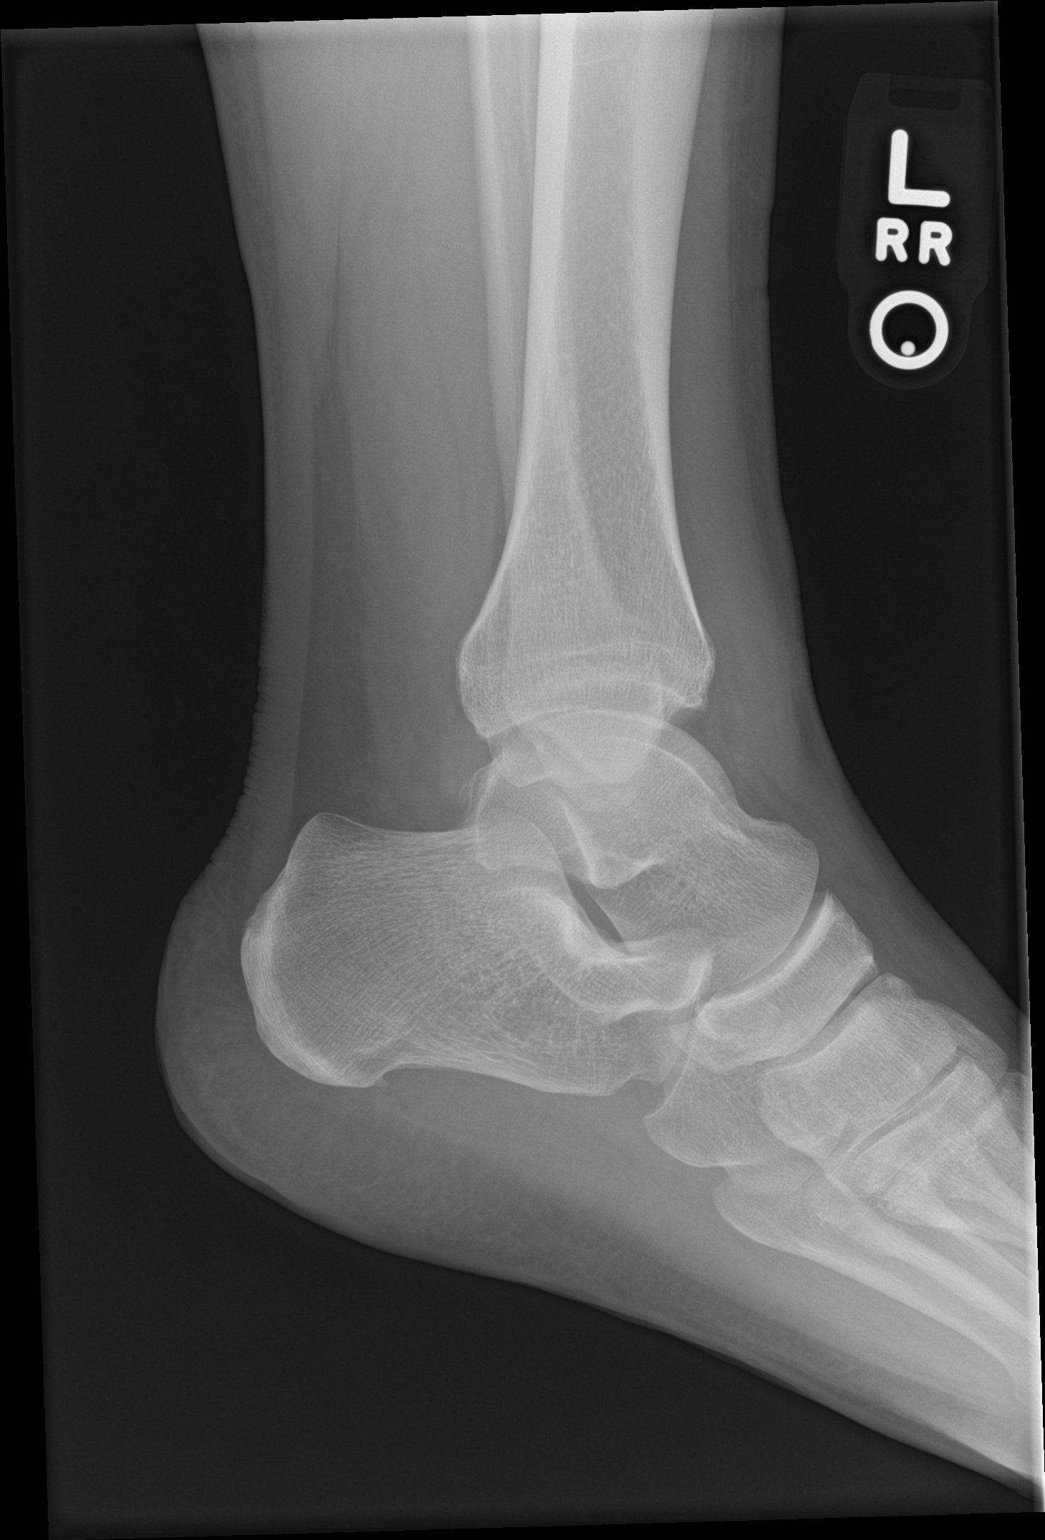

[3 of 3 positions shown; findings below may reference images not displayed]

FINDINGS: There is no evidence of fracture, dislocation, or joint effusion.
There is no evidence of arthropathy or other focal bone abnormality.
Soft tissues are unremarkable.
IMPRESSION: No fracture or dislocation of the left ankle. Joint spaces are
preserved.
